# Patient Record
Sex: Female | Born: 1995 | Race: White | Hispanic: No | Marital: Single | Smoking: Former smoker
Health system: Southern US, Community
[De-identification: ages and names within clinical notes are randomized; demographics above are authoritative.]

## PROBLEM LIST (undated history)

## (undated) DIAGNOSIS — B019 Varicella without complication: Secondary | ICD-10-CM

## (undated) DIAGNOSIS — R42 Dizziness and giddiness: Secondary | ICD-10-CM

## (undated) HISTORY — PX: WISDOM TOOTH EXTRACTION: SHX21

## (undated) HISTORY — DX: Dizziness and giddiness: R42

## (undated) HISTORY — DX: Varicella without complication: B01.9

---
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
## 2014-12-14 DIAGNOSIS — B009 Herpesviral infection, unspecified: Secondary | ICD-10-CM | POA: Insufficient documentation

## 2014-12-15 ENCOUNTER — Ambulatory Visit (INDEPENDENT_AMBULATORY_CARE_PROVIDER_SITE_OTHER): Payer: BLUE CROSS/BLUE SHIELD | Admitting: Family Medicine

## 2014-12-15 ENCOUNTER — Encounter: Payer: Self-pay | Admitting: Family Medicine

## 2014-12-15 VITALS — BP 109/74 | HR 72 | Temp 98.3°F | Ht 65.1 in | Wt 223.2 lb

## 2014-12-15 DIAGNOSIS — H6092 Unspecified otitis externa, left ear: Secondary | ICD-10-CM | POA: Diagnosis not present

## 2014-12-15 MED ORDER — CIPROFLOXACIN-DEXAMETHASONE 0.3-0.1 % OT SUSP: 4.0000 [drp] | Freq: Two times a day (BID) | OTIC | Status: AC

## 2014-12-15 NOTE — Progress Notes (Signed)
BP 109/74 mmHg  Pulse 72  Temp(Src) 98.3 F (36.8 C)  Ht 5' 5.1" (1.654 m)  Wt 223 lb 3.2 oz (101.243 kg)  BMI 37.01 kg/m2  SpO2 97%  LMP  (LMP Unknown)   Subjective:    Patient ID: Alexandra Massey, female    DOB: November 26, 1995, 19 y.o.   MRN: [id number]  HPI: Alexandra Massey is a 19 y.o. female  Chief Complaint  Patient presents with  . Ear Pain    left ear pain, sore to the touch, tried ear drops which made it worse, aching, complains that it has affected her jaw. Started 4 days again.    EAR Pain Duration: 4 days Involved ear(s):  left Sensation of feeling clogged/plugged: yes Decreased/muffled hearing:yes Ear pain: yes Fever: no Otorrhea: no Hearing loss: yes Upper respiratory infection symptoms: no Using Q-Tips: yes Status: worse History of cerumenosis: yes Treatments attempted: OTC ear drops  Relevant past medical, surgical, family and social history reviewed and updated as indicated. Interim medical history since our last visit reviewed. Allergies and medications reviewed and updated.  Review of Systems  Constitutional: Negative.   HENT: Positive for ear discharge, ear pain and hearing loss. Negative for congestion, dental problem, drooling, facial swelling, mouth sores, nosebleeds, postnasal drip, rhinorrhea, sinus pressure, sneezing, sore throat, tinnitus, trouble swallowing and voice change.   Respiratory: Negative.   Cardiovascular: Negative.   Psychiatric/Behavioral: Negative.     Per HPI unless specifically indicated above     Objective:    BP 109/74 mmHg  Pulse 72  Temp(Src) 98.3 F (36.8 C)  Ht 5' 5.1" (1.654 m)  Wt 223 lb 3.2 oz (101.243 kg)  BMI 37.01 kg/m2  SpO2 97%  LMP  (LMP Unknown)  Wt Readings from Last 3 Encounters:  12/15/14 223 lb 3.2 oz (101.243 kg)  12/04/12 181 lb (82.101 kg) (95 %*, Z = 1.69)   * Growth percentiles are based on CDC 2-20 Years data.    Physical Exam  Constitutional: She is oriented to person,  place, and time. She appears well-developed and well-nourished. No distress.  HENT:  Head: Normocephalic and atraumatic.  Right Ear: Hearing, tympanic membrane, external ear and ear canal normal.  Left Ear: Tympanic membrane normal. No lacerations. There is swelling and tenderness. No drainage. No foreign bodies. No mastoid tenderness. Tympanic membrane is not injected, not scarred, not perforated, not erythematous, not retracted and not bulging. Tympanic membrane mobility is normal.  No middle ear effusion. No hemotympanum. Decreased hearing is noted.  Nose: Nose normal.  Mouth/Throat: Oropharynx is clear and moist. No oropharyngeal exudate.  Pus in EAC, +tenderness on movement of the tragus  Eyes: Conjunctivae and lids are normal. Pupils are equal, round, and reactive to light. Right eye exhibits no discharge. Left eye exhibits no discharge. No scleral icterus.  Pulmonary/Chest: Effort normal. No respiratory distress.  Musculoskeletal: Normal range of motion.  Neurological: She is alert and oriented to person, place, and time.  Skin: Skin is intact. No rash noted.  Psychiatric: She has a normal mood and affect. Her speech is normal and behavior is normal. Judgment and thought content normal. Cognition and memory are normal.  Nursing note and vitals reviewed.   Results for orders placed or performed in visit on 04/10/13  Amylase  Result Value Ref Range   Amylase 33.0 25-106 Unit/L  Lipase, blood  Result Value Ref Range   Lipase 60 (L) 73-393 Unit/L      Assessment & Plan:  Problem List Items Addressed This Visit    None    Visit Diagnoses    Otitis externa, left    -  Primary    Will treat with ciprodex. Call if not getting better or getting worse.         Follow up plan: Return if symptoms worsen or fail to improve.

## 2014-12-15 NOTE — Patient Instructions (Signed)
Otitis Externa  Otitis externa is a germ infection in the outer ear. The outer ear is the area from the eardrum to the outside of the ear. Otitis externa is sometimes called "swimmer's ear."  HOME CARE  · Put drops in the ear as told by your doctor.  · Only take medicine as told by your doctor.  · If you have diabetes, your doctor may give you more directions. Follow your doctor's directions.  · Keep all doctor visits as told.  To avoid another infection:  · Keep your ear dry. Use the corner of a towel to dry your ear after swimming or bathing.  · Avoid scratching or putting things inside your ear.  · Avoid swimming in lakes, dirty water, or pools that use a chemical called chlorine poorly.  · You may use ear drops after swimming. Combine equal amounts of white vinegar and alcohol in a bottle. Put 3 or 4 drops in each ear.  GET HELP IF:   · You have a fever.  · Your ear is still red, puffy (swollen), or painful after 3 days.  · You still have yellowish-white fluid (pus) coming from the ear after 3 days.  · Your redness, puffiness, or pain gets worse.  · You have a really bad headache.  · You have redness, puffiness, pain, or tenderness behind your ear.  MAKE SURE YOU:   · Understand these instructions.  · Will watch your condition.  · Will get help right away if you are not doing well or get worse.  Document Released: 11/08/2007 Document Revised: 10/06/2013 Document Reviewed: 06/08/2011  [employer]® Patient Information ©2015 [employer]. This information is not intended to replace advice given to you by your health care provider. Make sure you discuss any questions you have with your health care provider.

## 2014-12-25 IMAGING — CR DG CHEST 2V
1 series · 2 of 2 positions shown · non-contrast
Comparison: None.

CLINICAL DATA: Fever and chest pain

EXAM:
CHEST  2 VIEW

[Series 1: pa · 0.17mm/px · 2 of 2 slices shown]
[im 1/2]
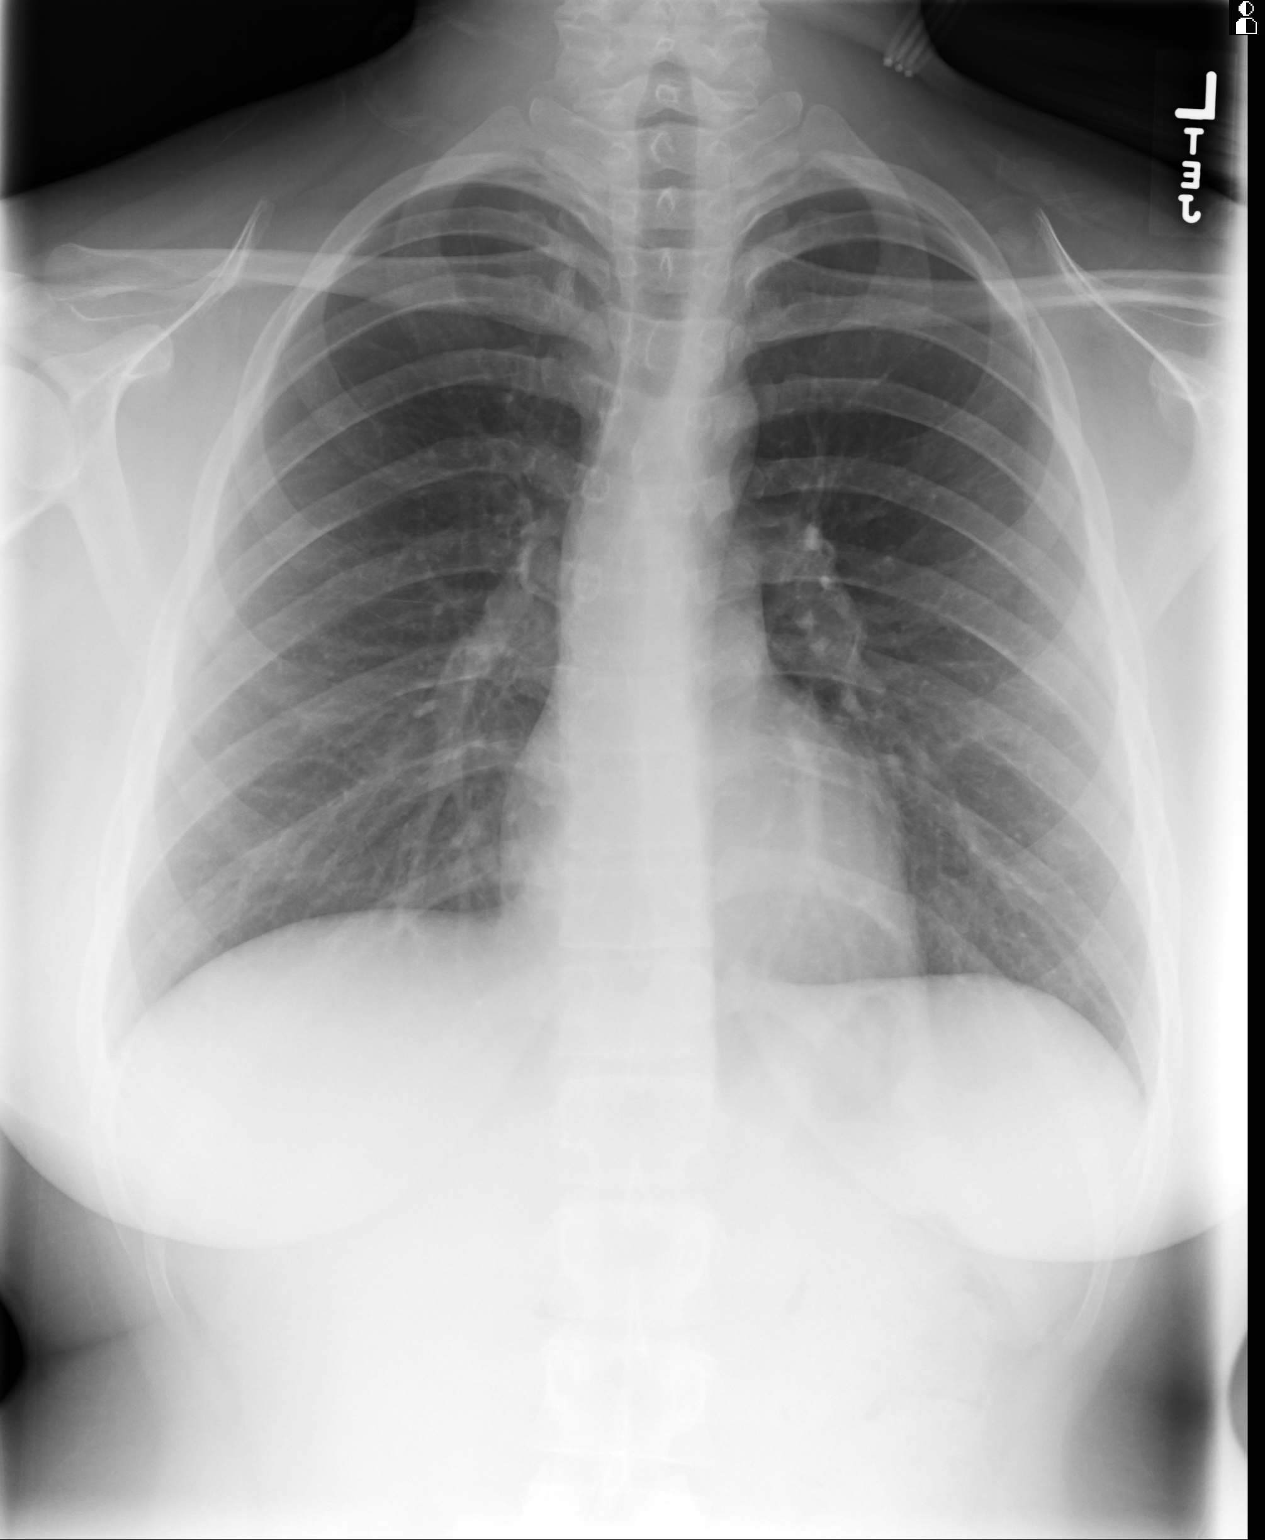
[im 2/2]
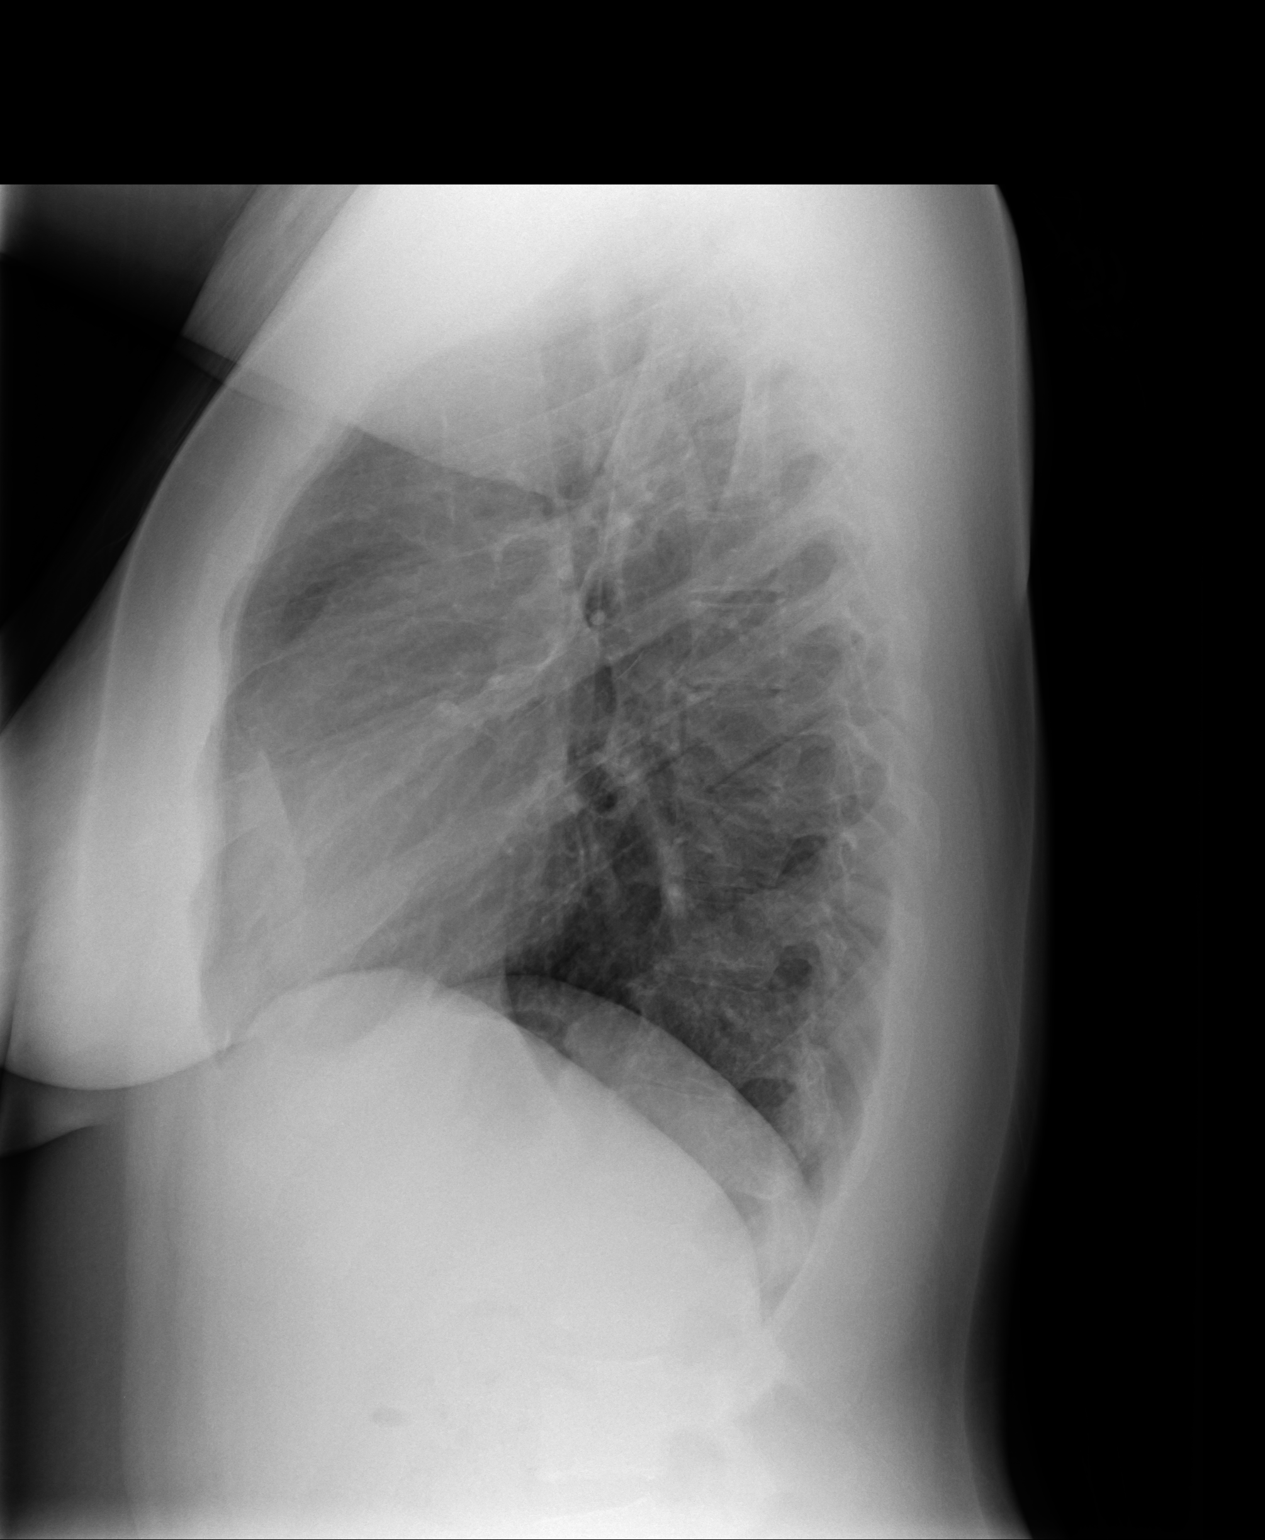

[2 of 2 positions shown; findings below may reference images not displayed]

FINDINGS: Lungs are clear. Heart size and pulmonary vascularity are normal. No
adenopathy. No bone lesions. No pneumothorax.
IMPRESSION: No abnormality noted.

## 2015-08-04 ENCOUNTER — Encounter: Payer: Self-pay | Admitting: Family Medicine

## 2015-08-04 ENCOUNTER — Ambulatory Visit (INDEPENDENT_AMBULATORY_CARE_PROVIDER_SITE_OTHER): Payer: BLUE CROSS/BLUE SHIELD | Admitting: Family Medicine

## 2015-08-04 VITALS — BP 101/69 | HR 69 | Temp 97.6°F | Ht 64.5 in | Wt 225.0 lb

## 2015-08-04 DIAGNOSIS — G8929 Other chronic pain: Secondary | ICD-10-CM | POA: Insufficient documentation

## 2015-08-04 DIAGNOSIS — M545 Low back pain: Secondary | ICD-10-CM | POA: Diagnosis not present

## 2015-08-04 MED ORDER — NAPROXEN 500 MG PO TABS: 500.0000 mg | ORAL_TABLET | Freq: Two times a day (BID) | ORAL | Status: DC

## 2015-08-04 NOTE — Assessment & Plan Note (Signed)
Discussed that medicine won't necessarily help with her issue. Will get her into see PT. Naproxen BID. Not enough spasm for muscle relaxer at this time. Recheck 3 weeks. Continue to monitor.

## 2015-08-04 NOTE — Progress Notes (Signed)
BP 101/69 mmHg  Pulse 69  Temp(Src) 97.6 F (36.4 C)  Ht 5' 4.5" (1.638 m)  Wt 225 lb (102.059 kg)  BMI 38.04 kg/m2  SpO2 97%   Subjective:    Patient ID: Alexandra Massey, female    DOB: [birth year], 20 y.o.   MRN: [id number]  HPI: Alexandra Massey is a 20 y.o. female  Chief Complaint  Patient presents with  . Back Pain    Patient injured her back at work, she states that her hips are out of alignment and she wakes up in severe pain. She states that it will pinch nerves.   ER FOLLOW UP Time since discharge: 6 days Hospital/facility: [facility] Diagnosis: lumbar strain Procedures/tests: None Consultants: none New medications: motrin and flexeril Discharge instructions: follow up here  Status: better  BACK PAIN- has done some marijuana for back pain in the past, nothing else Duration: 2 years, exacerbated for 6 days Mechanism of injury: lifting Location: bilateral and low back Onset: gradual Severity: 5/10 Quality: dull and aching Frequency: constant Radiation: none Aggravating factors: unknown- doesn't know what makes the pain better or worse Alleviating factors: marijuana, ice and heat Status: fluctuating Treatments attempted: rest, ice, heat, APAP, ibuprofen and aleve  Relief with NSAIDs?: mild Nighttime pain:  yes Paresthesias / decreased sensation:  no Bowel / bladder incontinence:  no Fevers:  no Dysuria / urinary frequency:  no  Relevant past medical, surgical, family and social history reviewed and updated as indicated. Interim medical history since our last visit reviewed. Allergies and medications reviewed and updated.  Review of Systems  Constitutional: Negative.   Respiratory: Negative.   Cardiovascular: Negative.   Musculoskeletal: Positive for myalgias, back pain and gait problem. Negative for joint swelling, arthralgias, neck pain and neck stiffness.  Psychiatric/Behavioral: Negative.     Per HPI unless specifically indicated above      Objective:    BP 101/69 mmHg  Pulse 69  Temp(Src) 97.6 F (36.4 C)  Ht 5' 4.5" (1.638 m)  Wt 225 lb (102.059 kg)  BMI 38.04 kg/m2  SpO2 97%  Wt Readings from Last 3 Encounters:  08/04/15 225 lb (102.059 kg)  12/15/14 223 lb 3.2 oz (101.243 kg)  12/04/12 181 lb (82.101 kg) (95 %*, Z = 1.69)   * Growth percentiles are based on CDC 2-20 Years data.    Physical Exam  Constitutional: She is oriented to person, place, and time. She appears well-developed and well-nourished. No distress.  HENT:  Head: Normocephalic and atraumatic.  Right Ear: Hearing normal.  Left Ear: Hearing normal.  Nose: Nose normal.  Eyes: Conjunctivae and lids are normal. Right eye exhibits no discharge. Left eye exhibits no discharge. No scleral icterus.  Cardiovascular: Normal rate, regular rhythm, normal heart sounds and intact distal pulses.  Exam reveals no gallop and no friction rub.   No murmur heard. Pulmonary/Chest: Effort normal and breath sounds normal. No respiratory distress. She has no wheezes. She has no rales. She exhibits no tenderness.  Neurological: She is alert and oriented to person, place, and time.  Skin: Skin is warm, dry and intact. No rash noted. No erythema. No pallor.  Psychiatric: She has a normal mood and affect. Her speech is normal and behavior is normal. Judgment and thought content normal. Cognition and memory are normal.  Nursing note and vitals reviewed. Back Exam:    Inspection:  Normal spinal curvature.  No deformity, ecchymosis, erythema, or lesions     Palpation:  Midline spinal tenderness: no      Paralumbar tenderness: yes bilateral     Parathoracic tenderness: no      Buttocks tenderness: no     Range of Motion:      Flexion: Fingers to Mid-[place]     Extension:Decreased     Lateral bending:Decreased    Rotation:Decreased    Neuro Exam:Lower extremity DTRs normal & symmetric.  Strength and sensation intact.    Special Tests:      Straight leg  raise:negative  Results for orders placed or performed in visit on 04/10/13  Amylase  Result Value Ref Range   Amylase 33.0 25-106 Unit/L  Lipase, blood  Result Value Ref Range   Lipase 60 (L) 73-393 Unit/L      Assessment & Plan:   Problem List Items Addressed This Visit      Other   Chronic low back pain - Primary    Discussed that medicine won't necessarily help with her issue. Will get her into see PT. Naproxen BID. Not enough spasm for muscle relaxer at this time. Recheck 3 weeks. Continue to monitor.       Relevant Medications   naproxen (NAPROSYN) 500 MG tablet       Follow up plan: Return in about 3 weeks (around 08/25/2015) for follow up back pain.

## 2015-08-25 ENCOUNTER — Ambulatory Visit (INDEPENDENT_AMBULATORY_CARE_PROVIDER_SITE_OTHER): Payer: BLUE CROSS/BLUE SHIELD | Admitting: Family Medicine

## 2015-08-25 ENCOUNTER — Encounter: Payer: Self-pay | Admitting: Family Medicine

## 2015-08-25 VITALS — BP 101/69 | HR 75 | Temp 97.8°F | Ht 65.3 in | Wt 223.0 lb

## 2015-08-25 DIAGNOSIS — M545 Low back pain: Secondary | ICD-10-CM

## 2015-08-25 DIAGNOSIS — G8929 Other chronic pain: Secondary | ICD-10-CM

## 2015-08-25 MED ORDER — TIZANIDINE HCL 4 MG PO CAPS: 4.0000 mg | ORAL_CAPSULE | Freq: Three times a day (TID) | ORAL | Status: DC | PRN

## 2015-08-25 MED ORDER — NAPROXEN 500 MG PO TABS: 500.0000 mg | ORAL_TABLET | Freq: Two times a day (BID) | ORAL | Status: DC

## 2015-08-25 NOTE — Progress Notes (Signed)
BP 101/69 mmHg  Pulse 75  Temp(Src) 97.8 F (36.6 C)  Ht 5' 5.3" (1.659 m)  Wt 223 lb (101.152 kg)  BMI 36.75 kg/m2  SpO2 99%   Subjective:    Patient ID: Alexandra Massey, female    DOB: [birth year], 20 y.o.   MRN: [id number]  HPI: Alexandra Massey is a 20 y.o. female  Chief Complaint  Patient presents with  . Back Pain    follow up   BACK PAIN- has done some marijuana for back pain in the past, nothing else Duration: 2 years, better since last visit, but still not normal. Has not gotten in to see PT yet due to cost Mechanism of injury: lifting Location: bilateral and low back Onset: gradual Severity: 5/10 Quality: dull and aching Frequency: constant Radiation: none Aggravating factors: unknown- doesn't know what makes the pain better or worse Alleviating factors: marijuana, ice and heat, naproxen sometimes Status: fluctuating Treatments attempted: rest, ice, heat, APAP, ibuprofen and aleve  Relief with NSAIDs?: moderate Nighttime pain: yes Paresthesias / decreased sensation: no Bowel / bladder incontinence: no Fevers: no Dysuria / urinary frequency: no  Relevant past medical, surgical, family and social history reviewed and updated as indicated. Interim medical history since our last visit reviewed. Allergies and medications reviewed and updated.  Review of Systems  Constitutional: Negative.   Respiratory: Negative.   Cardiovascular: Negative.   Musculoskeletal: Positive for myalgias and back pain. Negative for joint swelling, arthralgias, gait problem, neck pain and neck stiffness.  Psychiatric/Behavioral: Negative.     Per HPI unless specifically indicated above     Objective:    BP 101/69 mmHg  Pulse 75  Temp(Src) 97.8 F (36.6 C)  Ht 5' 5.3" (1.659 m)  Wt 223 lb (101.152 kg)  BMI 36.75 kg/m2  SpO2 99%  Wt Readings from Last 3 Encounters:  08/25/15 223 lb (101.152 kg)  08/04/15 225 lb (102.059 kg)  12/15/14 223 lb 3.2 oz (101.243 kg)     Physical Exam  Constitutional: She is oriented to person, place, and time. She appears well-developed and well-nourished. No distress.  HENT:  Head: Normocephalic and atraumatic.  Right Ear: Hearing normal.  Left Ear: Hearing normal.  Nose: Nose normal.  Eyes: Conjunctivae and lids are normal. Right eye exhibits no discharge. Left eye exhibits no discharge. No scleral icterus.  Pulmonary/Chest: Effort normal. No respiratory distress.  Neurological: She is alert and oriented to person, place, and time.  Skin: Skin is warm, dry and intact. No rash noted. No erythema. No pallor.  Psychiatric: She has a normal mood and affect. Her speech is normal and behavior is normal. Judgment and thought content normal. Cognition and memory are normal.  Nursing note and vitals reviewed. Back Exam:    Inspection:  Normal spinal curvature.  No deformity, ecchymosis, erythema, or lesions     Palpation:     Midline spinal tenderness: no      Paralumbar tenderness: yes R>L     Parathoracic tenderness: no      Buttocks tenderness: no     Range of Motion:      Flexion: Fingers to Knees     Extension:Decreased     Lateral bending:Decreased    Rotation:Decreased    Neuro Exam:Lower extremity DTRs normal & symmetric.  Strength and sensation intact.    Special Tests:      Straight leg raise:negative   Results for orders placed or performed in visit on 04/10/13  Amylase  Result Value Ref  Range   Amylase 33.0 25-106 Unit/L  Lipase, blood  Result Value Ref Range   Lipase 60 (L) 73-393 Unit/L      Assessment & Plan:   Problem List Items Addressed This Visit      Other   Chronic low back pain - Primary    Slightly more spasm than previously. Will treat with tizanidine. Continue naproxen. Encouraged her to go to PT. Call if getting worse. Continue exercise. Follow up 1 month.       Relevant Medications   tiZANidine (ZANAFLEX) 4 MG capsule   naproxen (NAPROSYN) 500 MG tablet       Follow up  plan: Return in about 4 weeks (around 09/22/2015) for follow up back.

## 2015-08-25 NOTE — Assessment & Plan Note (Signed)
Slightly more spasm than previously. Will treat with tizanidine. Continue naproxen. Encouraged her to go to PT. Call if getting worse. Continue exercise. Follow up 1 month.

## 2015-08-31 ENCOUNTER — Telehealth: Payer: Self-pay | Admitting: Family Medicine

## 2015-08-31 NOTE — Telephone Encounter (Signed)
[facility] called and stated that they needed a referral sent over regarding the pts lower back pain. Fax number [phone]

## 2015-09-28 ENCOUNTER — Ambulatory Visit (INDEPENDENT_AMBULATORY_CARE_PROVIDER_SITE_OTHER): Payer: BLUE CROSS/BLUE SHIELD | Admitting: Family Medicine

## 2015-09-28 ENCOUNTER — Encounter: Payer: Self-pay | Admitting: Family Medicine

## 2015-09-28 VITALS — BP 104/72 | HR 71 | Temp 98.3°F | Ht 64.7 in | Wt 218.0 lb

## 2015-09-28 DIAGNOSIS — M545 Low back pain: Secondary | ICD-10-CM | POA: Diagnosis not present

## 2015-09-28 DIAGNOSIS — G8929 Other chronic pain: Secondary | ICD-10-CM | POA: Diagnosis not present

## 2015-09-28 MED ORDER — NYSTATIN-TRIAMCINOLONE 100000-0.1 UNIT/GM-% EX OINT: 1.0000 "application " | TOPICAL_OINTMENT | Freq: Two times a day (BID) | CUTANEOUS | Status: DC

## 2015-09-28 NOTE — Progress Notes (Signed)
BP 104/72 mmHg  Pulse 71  Temp(Src) 98.3 F (36.8 C)  Ht 5' 4.7" (1.643 m)  Wt 218 lb (98.884 kg)  BMI 36.63 kg/m2  SpO2 97%   Subjective:    Patient ID: Alexandra Massey, female    DOB: 07-Jun-1995, 20 y.o.   MRN: [id number]  HPI: Alexandra ColonelVictoria M Massey is a 20 y.o. female  Chief Complaint  Patient presents with  . Back Pain    Patient states that the pain is better, except now due to rain and a long car ride.   BACK PAIN- has not been doing the stretches, but has been doing PT and that has been helping. Was doing well until about a day ago with a long car ride  Duration: 2 years, better since last visit, but still not normal. PT has been helping Mechanism of injury: lifting Location: bilateral and low back Onset: gradual Severity: 5/10 Quality: dull and aching Frequency: constant Radiation: none Aggravating factors: unknown- doesn't know what makes the pain better or worse, medicine helps sometimes Alleviating factors: marijuana, ice and heat, naproxen sometimes Status: fluctuating Treatments attempted: rest, ice, heat, APAP, ibuprofen and aleve  Relief with NSAIDs?: moderate Nighttime pain: yes Paresthesias / decreased sensation: no Bowel / bladder incontinence: no Fevers: no Dysuria / urinary frequency: no  Relevant past medical, surgical, family and social history reviewed and updated as indicated. Interim medical history since our last visit reviewed. Allergies and medications reviewed and updated.  Review of Systems  Constitutional: Negative.   Respiratory: Negative.   Cardiovascular: Negative.   Musculoskeletal: Positive for back pain. Negative for myalgias, joint swelling, arthralgias, gait problem, neck pain and neck stiffness.  Psychiatric/Behavioral: Negative.     Per HPI unless specifically indicated above     Objective:    BP 104/72 mmHg  Pulse 71  Temp(Src) 98.3 F (36.8 C)  Ht 5' 4.7" (1.643 m)  Wt 218 lb (98.884 kg)  BMI 36.63 kg/m2   SpO2 97%  Wt Readings from Last 3 Encounters:  09/28/15 218 lb (98.884 kg)  08/25/15 223 lb (101.152 kg)  08/04/15 225 lb (102.059 kg)    Physical Exam  Constitutional: She is oriented to person, place, and time. She appears well-developed and well-nourished. No distress.  HENT:  Head: Normocephalic and atraumatic.  Right Ear: Hearing normal.  Left Ear: Hearing normal.  Nose: Nose normal.  Eyes: Conjunctivae and lids are normal. Right eye exhibits no discharge. Left eye exhibits no discharge. No scleral icterus.  Cardiovascular: Normal rate, regular rhythm, normal heart sounds and intact distal pulses.  Exam reveals no gallop and no friction rub.   No murmur heard. Pulmonary/Chest: Effort normal and breath sounds normal. No respiratory distress. She has no wheezes. She has no rales. She exhibits no tenderness.  Neurological: She is alert and oriented to person, place, and time.  Skin: Skin is warm, dry and intact. No rash noted. No erythema. No pallor.  Psychiatric: She has a normal mood and affect. Her speech is normal and behavior is normal. Judgment and thought content normal. Cognition and memory are normal.  Nursing note and vitals reviewed. Back Exam:    Inspection:  Normal spinal curvature.  No deformity, ecchymosis, erythema, or lesions     Palpation:     Midline spinal tenderness: no      Paralumbar tenderness: yes bilateral     Parathoracic tenderness: no      Buttocks tenderness: no     Range of Motion:  Flexion: Fingers to Knees     Extension:Decreased     Lateral bending:Decreased    Rotation:Decreased    Neuro Exam:Lower extremity DTRs normal & symmetric.  Strength and sensation intact.    Special Tests:      Straight leg raise:negative    Results for orders placed or performed in visit on 04/10/13  Amylase  Result Value Ref Range   Amylase 33.0 25-106 Unit/L  Lipase, blood  Result Value Ref Range   Lipase 60 (L) 73-393 Unit/L      Assessment & Plan:    Problem List Items Addressed This Visit      Other   Chronic low back pain - Primary    Continue current regimen. Continue to monitor. Call with any problems. Recheck after PT.           Follow up plan: Return 2-3 months, for follow up back.

## 2015-09-28 NOTE — Assessment & Plan Note (Signed)
Continue current regimen. Continue to monitor. Call with any problems. Recheck after PT.

## 2015-11-25 ENCOUNTER — Telehealth: Payer: Self-pay | Admitting: Family Medicine

## 2015-11-25 MED ORDER — NYSTATIN-TRIAMCINOLONE 100000-0.1 UNIT/GM-% EX OINT: 1.0000 "application " | TOPICAL_OINTMENT | Freq: Two times a day (BID) | CUTANEOUS | Status: DC

## 2015-11-25 NOTE — Telephone Encounter (Signed)
Rx sent to her pharmacy. If this doesn't make it go away, needs to be seen

## 2015-11-25 NOTE — Telephone Encounter (Signed)
Dr.Johnson, is it possible to call something in for her, or does she need to be seen?

## 2015-11-25 NOTE — Telephone Encounter (Signed)
Patient needs to talk to Dr. Laural BenesJohnson about getting a new Rx for her ringworm. Also needs to talk to her about it, thanks.

## 2015-11-25 NOTE — Telephone Encounter (Signed)
Left message for patient letting her know that medication was sent to her pharmacy.

## 2015-11-30 ENCOUNTER — Ambulatory Visit: Payer: BLUE CROSS/BLUE SHIELD | Admitting: Family Medicine

## 2016-02-08 ENCOUNTER — Telehealth: Payer: Self-pay | Admitting: Family Medicine

## 2016-02-08 ENCOUNTER — Other Ambulatory Visit: Payer: Self-pay | Admitting: Family Medicine

## 2016-02-08 MED ORDER — PENCICLOVIR 1 % EX CREA: 1.0000 "application " | TOPICAL_CREAM | CUTANEOUS | 0 refills | Status: DC

## 2016-02-08 MED ORDER — VALACYCLOVIR HCL 1 G PO TABS: 1000.0000 mg | ORAL_TABLET | Freq: Two times a day (BID) | ORAL | 0 refills | Status: AC

## 2016-02-08 NOTE — Telephone Encounter (Signed)
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

## 2016-02-08 NOTE — Telephone Encounter (Signed)
Patient notified about rxs. 

## 2016-02-08 NOTE — Telephone Encounter (Signed)
Routing to provider  

## 2016-02-08 NOTE — Telephone Encounter (Signed)
Rxs sent to her pharmacy 

## 2016-02-13 ENCOUNTER — Ambulatory Visit
Admission: EM | Admit: 2016-02-13 | Discharge: 2016-02-13 | Disposition: A | Discharge status: Home / Self Care | Payer: BLUE CROSS/BLUE SHIELD | Attending: Family Medicine | Admitting: Family Medicine

## 2016-02-13 ENCOUNTER — Encounter: Payer: Self-pay | Admitting: Emergency Medicine

## 2016-02-13 DIAGNOSIS — B349 Viral infection, unspecified: Secondary | ICD-10-CM | POA: Diagnosis not present

## 2016-02-13 MED ORDER — ONDANSETRON 4 MG PO TBDP: 4.0000 mg | ORAL_TABLET | Freq: Three times a day (TID) | ORAL | 0 refills | Status: DC | PRN

## 2016-02-13 NOTE — Discharge Instructions (Signed)
Rest, hydration, BRAT diet as tolerated, Zofran when necessary, seek medical attention if symptoms persist or worsen as discussed.

## 2016-02-13 NOTE — ED Triage Notes (Signed)
Patient states that she has been out of work since [employer]with N/V/D for two days.  Patient states that her work is asking for a doctor's note.  Patient states that she is feeling better.  Patient states that she has a cough and congestion still.

## 2016-02-13 NOTE — ED Provider Notes (Signed)
[facility]    CSN: [id number] Arrival date & time: 02/13/16  0908  First Provider Contact:  None       History   Chief Complaint Chief Complaint  Patient presents with  . Mouth Lesions  . Cough  . Nasal Congestion    HPI Alexandra Massey is a 20 y.o. female.   HPI: Patient presents today with symptoms of nasal drainage, mild nonproductive cough, cold sore, nausea, vomiting, diarrhea. Patient states that her symptoms started a few days ago. Her vomiting and diarrhea has decreased some but still has some nausea. Patient denies any chest pain, shortness of breath, rash, urinary symptoms. Patient does have Mirena in place and does not have menstrual cycles. Her cold sore has started to heal on her lower lip. She is not taking any medications for his symptoms. She has tolerated some soup.  Past Medical History:  Diagnosis Date  . Chicken pox     Patient Active Problem List   Diagnosis Date Noted  . Chronic low back pain 08/04/2015  . 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 KNEE ARTHROSCOPY Right 2009   hperextended, went in and cleaned it out  . WISDOM TOOTH EXTRACTION      OB History    No data available       Home Medications    Prior to Admission medications   Medication Sig Start Date End Date Taking? Authorizing Provider  levonorgestrel (MIRENA) 20 MCG/24HR IUD 1 each by Intrauterine route once.   Yes Historical Provider, MD  valACYclovir (VALTREX) 1000 MG tablet Take 1 tablet (1,000 mg total) by mouth 2 (two) times daily. 02/08/16   Megan Holly BodilyP Johnson, DO    Family History Family History  Problem Relation Age of Onset  . Heart attack Paternal Grandfather   . Diabetes Paternal Grandfather     Social History Social History  Substance Use Topics  . Smoking status: Never Smoker  . Smokeless tobacco: Never Used  . Alcohol use 0.0 oz/week     Comment: Socially       Allergies   Review of patient's allergies indicates no known allergies.   Review of Systems Review of Systems: Negative except mentioned above.    Physical Exam Triage Vital Signs ED Triage Vitals  Enc Vitals Group     BP 02/13/16 0924 109/67     Pulse Rate 02/13/16 0924 85     Resp 02/13/16 0924 16     Temp 02/13/16 0924 97.6 F (36.4 C)     Temp Source 02/13/16 0924 Tympanic     SpO2 02/13/16 0924 100 %     Weight 02/13/16 0924 206 lb (93.4 kg)     Height 02/13/16 0924 5\' 5"  (1.651 m)     Head Circumference --      Peak Flow --      Pain Score 02/13/16 0926 3     Pain Loc --      Pain Edu? --      Excl. in GC? --    No data found.   Updated Vital Signs BP 109/67 (BP Location: Left Arm)   Pulse 85   Temp 97.6 F (36.4 C) (Tympanic)   Resp 16   Ht 5\' 5"  (1.651 m)   Wt 206 lb (93.4 kg)   SpO2 100%   BMI 34.28 kg/m     Physical Exam:   GENERAL: NAD HEENT: no  pharyngeal erythema, no exudate, no erythema of TMs, no cervical LAD RESP: CTA B CARD: RRR ABD: +BS, NT/ND, no rebound or guarding   NEURO: CN II-XII grossly intact    UC Treatments / Results  Labs (all labs ordered are listed, but only abnormal results are displayed) Labs Reviewed - No data to display  EKG  EKG Interpretation None       Radiology No results found.  Procedures Procedures (including critical care time)  Medications Ordered in UC Medications - No data to display   Initial Impression / Assessment and Plan / UC Course  I have reviewed the triage vital signs and the nursing notes.  Pertinent labs & imaging results that were available during my care of the patient were reviewed by me and considered in my medical decision making (see chart for details).  Clinical Course   A/P: Viral Illness - rest, hydration, BRAT diet, Zofran prn, seek medical attention if symptoms persist/worsen.   Final Clinical Impressions(s) / UC Diagnoses   Final diagnoses:  None     New Prescriptions New Prescriptions   No medications on file     Jolene Provost, MD 02/13/16 1009

## 2016-05-05 ENCOUNTER — Encounter: Payer: Self-pay | Admitting: Unknown Physician Specialty

## 2016-05-05 ENCOUNTER — Ambulatory Visit (INDEPENDENT_AMBULATORY_CARE_PROVIDER_SITE_OTHER): Payer: BLUE CROSS/BLUE SHIELD | Admitting: Unknown Physician Specialty

## 2016-05-05 VITALS — BP 107/77 | HR 67 | Temp 97.9°F | Ht 66.0 in | Wt 217.0 lb

## 2016-05-05 DIAGNOSIS — R52 Pain, unspecified: Secondary | ICD-10-CM

## 2016-05-05 DIAGNOSIS — J00 Acute nasopharyngitis [common cold]: Secondary | ICD-10-CM | POA: Diagnosis not present

## 2016-05-05 LAB — VERITOR FLU A/B WAIVED
INFLUENZA B: NEGATIVE
Influenza A: NEGATIVE

## 2016-05-05 NOTE — Patient Instructions (Addendum)
Upper Respiratory Infection, Adult Most upper respiratory infections (URIs) are a viral infection of the air passages leading to the lungs. A URI affects the nose, throat, and upper air passages. The most common type of URI is nasopharyngitis and is typically referred to as "the common cold." URIs run their course and usually go away on their own. Most of the time, a URI does not require medical attention, but sometimes a bacterial infection in the upper airways can follow a viral infection. This is called a secondary infection. Sinus and middle ear infections are common types of secondary upper respiratory infections. Bacterial pneumonia can also complicate a URI. A URI can worsen asthma and chronic obstructive pulmonary disease (COPD). Sometimes, these complications can require emergency medical care and may be life threatening. What are the causes? Almost all URIs are caused by viruses. A virus is a type of germ and can spread from one person to another. What increases the risk? You may be at risk for a URI if:  You smoke.  You have chronic heart or lung disease.  You have a weakened defense (immune) system.  You are very young or very old.  You have nasal allergies or asthma.  You work in crowded or poorly ventilated areas.  You work in health care facilities or schools.  What are the signs or symptoms? Symptoms typically develop 2-3 days after you come in contact with a cold virus. Most viral URIs last 7-10 days. However, viral URIs from the influenza virus (flu virus) can last 14-18 days and are typically more severe. Symptoms may include:  Runny or stuffy (congested) nose.  Sneezing.  Cough.  Sore throat.  Headache.  Fatigue.  Fever.  Loss of appetite.  Pain in your forehead, behind your eyes, and over your cheekbones (sinus pain).  Muscle aches.  How is this diagnosed? Your health care provider may diagnose a URI by:  Physical exam.  Tests to check that your  symptoms are not due to another condition such as: ? Strep throat. ? Sinusitis. ? Pneumonia. ? Asthma.  How is this treated? A URI goes away on its own with time. It cannot be cured with medicines, but medicines may be prescribed or recommended to relieve symptoms. Medicines may help:  Reduce your fever.  Reduce your cough.  Relieve nasal congestion.  Follow these instructions at home:  Take medicines only as directed by your health care provider.  Gargle warm saltwater or take cough drops to comfort your throat as directed by your health care provider.  Use a warm mist humidifier or inhale steam from a shower to increase air moisture. This may make it easier to breathe.  Drink enough fluid to keep your urine clear or pale yellow.  Eat soups and other clear broths and maintain good nutrition.  Rest as needed.  Return to work when your temperature has returned to normal or as your health care provider advises. You may need to stay home longer to avoid infecting others. You can also use a face mask and careful hand washing to prevent spread of the virus.  Increase the usage of your inhaler if you have asthma.  Do not use any tobacco products, including cigarettes, chewing tobacco, or electronic cigarettes. If you need help quitting, ask your health care provider. How is this prevented? The best way to protect yourself from getting a cold is to practice good hygiene.  Avoid oral or hand contact with people with cold symptoms.  Wash your   occurs. There is no clear evidence that vitamin C, vitamin E, echinacea, or exercise reduces the chance of developing a cold. However, it is always recommended to get plenty of rest, exercise, and practice good nutrition. Contact a health care provider if:  You are getting worse rather than better.  Your symptoms are not controlled by medicine.  You have chills.  You have worsening shortness of breath.  You have brown  or red mucus.  You have yellow or brown nasal discharge.  You have pain in your face, especially when you bend forward.  You have a fever.  You have swollen neck glands.  You have pain while swallowing.  You have white areas in the back of your throat. Get help right away if:  You have severe or persistent:  Headache.  Ear pain.  Sinus pain.  Chest pain.  You have chronic lung disease and any of the following:  Wheezing.  Prolonged cough.  Coughing up blood.  A change in your usual mucus.  You have a stiff neck.  You have changes in your:  Vision.  Hearing.  Thinking.  Mood. This information is not intended to replace advice given to you by your health care provider. Make sure you discuss any questions you have with your health care provider. Document Released: 11/15/2000 Document Revised: 01/23/2016 Document Reviewed: 08/27/2013 Elsevier Interactive Patient Education  2017 [employer]  I recommend pseudophed OTC

## 2016-05-05 NOTE — Progress Notes (Signed)
BP 107/77 (BP Location: Left Arm, Patient Position: Sitting, Cuff Size: Large)   Pulse 67   Temp 97.9 F (36.6 C) Comment: ear  Ht _0  (1.676 m)   Wt 217 lb (98.4 kg)   SpO2 99%   BMI 35.02 kg/m    Subjective:    Patient ID: Alexandra Massey, female    DOB: February 26, 1996, 20 y.o.   MRN: [id number]  HPI: Alexandra Massey is a 20 y.o. female  Chief Complaint  Patient presents with  . URI    pt states she has a had congestion, runny nose, ear fullness, cough, sneezing, scratchy throat, headache, some dizziness, no energy, and achy. Patient states symptoms started about a week ago.    URI   This is a new problem. Episode onset: Feeling like she will get sick for a week and this AM "it really hit me" The problem has been rapidly worsening. Maximum temperature: felt warm. The fever has been present for less than 1 day. Associated symptoms include congestion, coughing, nausea, rhinorrhea and a sore throat. She has tried acetaminophen and decongestant for the symptoms. The treatment provided mild relief.    Relevant past medical, surgical, family and social history reviewed and updated as indicated. Interim medical history since our last visit reviewed. Allergies and medications reviewed and updated.  Review of Systems  HENT: Positive for congestion, rhinorrhea and sore throat.   Respiratory: Positive for cough.   Gastrointestinal: Positive for nausea.    Per HPI unless specifically indicated above     Objective:    BP 107/77 (BP Location: Left Arm, Patient Position: Sitting, Cuff Size: Large)   Pulse 67   Temp 97.9 F (36.6 C) Comment: ear  Ht _1  (1.676 m)   Wt 217 lb (98.4 kg)   SpO2 99%   BMI 35.02 kg/m   Wt Readings from Last 3 Encounters:  05/05/16 217 lb (98.4 kg)  02/13/16 206 lb (93.4 kg)  09/28/15 218 lb (98.9 kg)    Physical Exam  Constitutional: She is oriented to person, place, and time. She appears well-developed and well-nourished. No distress.   HENT:  Head: Normocephalic and atraumatic.  Right Ear: Tympanic membrane and ear canal normal.  Left Ear: Tympanic membrane and ear canal normal.  Nose: Rhinorrhea present. Right sinus exhibits no maxillary sinus tenderness and no frontal sinus tenderness. Left sinus exhibits no maxillary sinus tenderness and no frontal sinus tenderness.  Mouth/Throat: Mucous membranes are normal. Posterior oropharyngeal erythema present.  Eyes: Conjunctivae and lids are normal. Right eye exhibits no discharge. Left eye exhibits no discharge. No scleral icterus.  Cardiovascular: Normal rate and regular rhythm.   Pulmonary/Chest: Effort normal and breath sounds normal. No respiratory distress.  Abdominal: Normal appearance. There is no splenomegaly or hepatomegaly.  Musculoskeletal: Normal range of motion.  Neurological: She is alert and oriented to person, place, and time.  Skin: Skin is intact. No rash noted. No pallor.  Psychiatric: She has a normal mood and affect. Her behavior is normal. Judgment and thought content normal.    Results for orders placed or performed in visit on 04/10/13  CBC with Differential/Platelet  Result Value Ref Range   WBC 6.7 3.6 - 11.0 x10 3/mm 3   RBC 4.70 3.80 - 5.20 X10 6/mm 3   HGB 14.1 12.0 - 16.0 g/dL   HCT 42.2 35.0 - 47.0 %   MCV 90 80 - 100 fL   MCH 29.9 26.0 - 34.0 pg  MCHC 33.3 32.0 - 36.0 g/dL   RDW 13.4 11.5 - 14.5 %   Platelet 281 150 - 440 x10 3/mm 3   Neutrophil % 76.5 %   Lymphocyte % 13.6 %   Monocyte % 8.7 %   Eosinophil % 0.4 %   Basophil % 0.8 %   Neutrophil # 5.1 1.4 - 6.5 x10 3/mm 3   Lymphocyte # 0.9 (L) 1.0 - 3.6 x10 3/mm 3   Monocyte # 0.6 0.2 - 0.9 x10 3/mm    Eosinophil # 0.0 0.0 - 0.7 x10 3/mm 3   Basophil # 0.1 0.0 - 0.1 x10 3/mm 3  Comprehensive metabolic panel  Result Value Ref Range   Glucose 103 (H) 65 - 99 mg/dL   BUN 7 (L) 9 - 21 mg/dL   Creatinine 0.85 0.60 - 1.30 mg/dL   Sodium 140 132 - 141 mmol/L   Potassium 3.8 3.3 -  4.7 mmol/L   Chloride 103 97 - 107 mmol/L   Co2 29 (H) 16 - 25 mmol/L   Calcium, Total 9.1 9.0 - 10.7 mg/dL   SGOT(AST) 14 0 - 26 Unit/L   SGPT (ALT) 24 12 - 78 U/L   Alkaline Phosphatase 70 (L) 82 - 169 Unit/L   Albumin 3.8 3.8 - 5.6 g/dL   Total Protein 7.6 6.4 - 8.6 g/dL   Bilirubin,Total 0.3 0.2 - 1.0 mg/dL   Osmolality 278 275 - 301   Anion Gap 8 7 - 16   EGFR (African American) >60    EGFR (Non-African Amer.) >60   Pregnancy, urine  Result Value Ref Range   Pregnancy Test, Urine NEGATIVE mIU/mL      Assessment & Plan:   Problem List Items Addressed This Visit    None    Visit Diagnoses    Body aches    -  Primary   Relevant Orders   Veritor Flu A/B Waived   Acute nasopharyngitis           Follow up plan: Return if symptoms worsen or fail to improve.

## 2016-11-06 DIAGNOSIS — M545 Low back pain: Secondary | ICD-10-CM | POA: Diagnosis not present

## 2016-11-06 DIAGNOSIS — M9904 Segmental and somatic dysfunction of sacral region: Secondary | ICD-10-CM | POA: Diagnosis not present

## 2016-11-06 DIAGNOSIS — M9902 Segmental and somatic dysfunction of thoracic region: Secondary | ICD-10-CM | POA: Diagnosis not present

## 2016-11-06 DIAGNOSIS — M9903 Segmental and somatic dysfunction of lumbar region: Secondary | ICD-10-CM | POA: Diagnosis not present

## 2016-11-09 DIAGNOSIS — M9903 Segmental and somatic dysfunction of lumbar region: Secondary | ICD-10-CM | POA: Diagnosis not present

## 2016-11-09 DIAGNOSIS — M9902 Segmental and somatic dysfunction of thoracic region: Secondary | ICD-10-CM | POA: Diagnosis not present

## 2016-11-09 DIAGNOSIS — M545 Low back pain: Secondary | ICD-10-CM | POA: Diagnosis not present

## 2016-11-09 DIAGNOSIS — M9904 Segmental and somatic dysfunction of sacral region: Secondary | ICD-10-CM | POA: Diagnosis not present

## 2016-11-14 DIAGNOSIS — M9904 Segmental and somatic dysfunction of sacral region: Secondary | ICD-10-CM | POA: Diagnosis not present

## 2016-11-14 DIAGNOSIS — M9902 Segmental and somatic dysfunction of thoracic region: Secondary | ICD-10-CM | POA: Diagnosis not present

## 2016-11-14 DIAGNOSIS — M545 Low back pain: Secondary | ICD-10-CM | POA: Diagnosis not present

## 2016-11-14 DIAGNOSIS — M9903 Segmental and somatic dysfunction of lumbar region: Secondary | ICD-10-CM | POA: Diagnosis not present

## 2016-11-16 DIAGNOSIS — M545 Low back pain: Secondary | ICD-10-CM | POA: Diagnosis not present

## 2016-11-16 DIAGNOSIS — M9903 Segmental and somatic dysfunction of lumbar region: Secondary | ICD-10-CM | POA: Diagnosis not present

## 2016-11-16 DIAGNOSIS — M9904 Segmental and somatic dysfunction of sacral region: Secondary | ICD-10-CM | POA: Diagnosis not present

## 2016-11-16 DIAGNOSIS — M9902 Segmental and somatic dysfunction of thoracic region: Secondary | ICD-10-CM | POA: Diagnosis not present

## 2016-11-21 DIAGNOSIS — M9904 Segmental and somatic dysfunction of sacral region: Secondary | ICD-10-CM | POA: Diagnosis not present

## 2016-11-21 DIAGNOSIS — M9902 Segmental and somatic dysfunction of thoracic region: Secondary | ICD-10-CM | POA: Diagnosis not present

## 2016-11-21 DIAGNOSIS — M9903 Segmental and somatic dysfunction of lumbar region: Secondary | ICD-10-CM | POA: Diagnosis not present

## 2016-11-21 DIAGNOSIS — M545 Low back pain: Secondary | ICD-10-CM | POA: Diagnosis not present

## 2016-11-24 DIAGNOSIS — M9902 Segmental and somatic dysfunction of thoracic region: Secondary | ICD-10-CM | POA: Diagnosis not present

## 2016-11-24 DIAGNOSIS — M9903 Segmental and somatic dysfunction of lumbar region: Secondary | ICD-10-CM | POA: Diagnosis not present

## 2016-11-24 DIAGNOSIS — M545 Low back pain: Secondary | ICD-10-CM | POA: Diagnosis not present

## 2016-11-24 DIAGNOSIS — M9904 Segmental and somatic dysfunction of sacral region: Secondary | ICD-10-CM | POA: Diagnosis not present

## 2016-11-30 DIAGNOSIS — M9902 Segmental and somatic dysfunction of thoracic region: Secondary | ICD-10-CM | POA: Diagnosis not present

## 2016-11-30 DIAGNOSIS — M9903 Segmental and somatic dysfunction of lumbar region: Secondary | ICD-10-CM | POA: Diagnosis not present

## 2016-11-30 DIAGNOSIS — M9904 Segmental and somatic dysfunction of sacral region: Secondary | ICD-10-CM | POA: Diagnosis not present

## 2016-11-30 DIAGNOSIS — M545 Low back pain: Secondary | ICD-10-CM | POA: Diagnosis not present

## 2016-12-04 DIAGNOSIS — M9904 Segmental and somatic dysfunction of sacral region: Secondary | ICD-10-CM | POA: Diagnosis not present

## 2016-12-04 DIAGNOSIS — M545 Low back pain: Secondary | ICD-10-CM | POA: Diagnosis not present

## 2016-12-04 DIAGNOSIS — M9903 Segmental and somatic dysfunction of lumbar region: Secondary | ICD-10-CM | POA: Diagnosis not present

## 2016-12-04 DIAGNOSIS — M9902 Segmental and somatic dysfunction of thoracic region: Secondary | ICD-10-CM | POA: Diagnosis not present

## 2016-12-07 DIAGNOSIS — M9904 Segmental and somatic dysfunction of sacral region: Secondary | ICD-10-CM | POA: Diagnosis not present

## 2016-12-07 DIAGNOSIS — M9902 Segmental and somatic dysfunction of thoracic region: Secondary | ICD-10-CM | POA: Diagnosis not present

## 2016-12-07 DIAGNOSIS — M9903 Segmental and somatic dysfunction of lumbar region: Secondary | ICD-10-CM | POA: Diagnosis not present

## 2016-12-07 DIAGNOSIS — M545 Low back pain: Secondary | ICD-10-CM | POA: Diagnosis not present

## 2016-12-18 DIAGNOSIS — M545 Low back pain: Secondary | ICD-10-CM | POA: Diagnosis not present

## 2016-12-18 DIAGNOSIS — M9902 Segmental and somatic dysfunction of thoracic region: Secondary | ICD-10-CM | POA: Diagnosis not present

## 2016-12-18 DIAGNOSIS — M9903 Segmental and somatic dysfunction of lumbar region: Secondary | ICD-10-CM | POA: Diagnosis not present

## 2016-12-18 DIAGNOSIS — M9904 Segmental and somatic dysfunction of sacral region: Secondary | ICD-10-CM | POA: Diagnosis not present

## 2017-01-11 DIAGNOSIS — M9903 Segmental and somatic dysfunction of lumbar region: Secondary | ICD-10-CM | POA: Diagnosis not present

## 2017-01-11 DIAGNOSIS — M545 Low back pain: Secondary | ICD-10-CM | POA: Diagnosis not present

## 2017-01-11 DIAGNOSIS — M9902 Segmental and somatic dysfunction of thoracic region: Secondary | ICD-10-CM | POA: Diagnosis not present

## 2017-01-11 DIAGNOSIS — M9904 Segmental and somatic dysfunction of sacral region: Secondary | ICD-10-CM | POA: Diagnosis not present

## 2017-07-16 DIAGNOSIS — M25522 Pain in left elbow: Secondary | ICD-10-CM | POA: Diagnosis not present

## 2017-07-16 DIAGNOSIS — M25562 Pain in left knee: Secondary | ICD-10-CM | POA: Diagnosis not present

## 2017-07-16 DIAGNOSIS — S52122A Displaced fracture of head of left radius, initial encounter for closed fracture: Secondary | ICD-10-CM | POA: Diagnosis not present

## 2017-07-17 DIAGNOSIS — S52122D Displaced fracture of head of left radius, subsequent encounter for closed fracture with routine healing: Secondary | ICD-10-CM | POA: Diagnosis not present

## 2017-07-17 DIAGNOSIS — S52122A Displaced fracture of head of left radius, initial encounter for closed fracture: Secondary | ICD-10-CM | POA: Diagnosis not present

## 2017-07-17 DIAGNOSIS — M25562 Pain in left knee: Secondary | ICD-10-CM | POA: Diagnosis not present

## 2017-07-18 ENCOUNTER — Other Ambulatory Visit: Payer: Self-pay | Admitting: Surgery

## 2017-07-18 DIAGNOSIS — S52122A Displaced fracture of head of left radius, initial encounter for closed fracture: Secondary | ICD-10-CM

## 2017-07-23 DIAGNOSIS — S52122A Displaced fracture of head of left radius, initial encounter for closed fracture: Secondary | ICD-10-CM | POA: Diagnosis not present

## 2017-07-25 DIAGNOSIS — S52122D Displaced fracture of head of left radius, subsequent encounter for closed fracture with routine healing: Secondary | ICD-10-CM | POA: Diagnosis not present

## 2017-07-27 ENCOUNTER — Ambulatory Visit: Payer: BLUE CROSS/BLUE SHIELD | Attending: Surgery

## 2017-08-02 DIAGNOSIS — S52122D Displaced fracture of head of left radius, subsequent encounter for closed fracture with routine healing: Secondary | ICD-10-CM | POA: Diagnosis not present

## 2017-08-07 DIAGNOSIS — S52122D Displaced fracture of head of left radius, subsequent encounter for closed fracture with routine healing: Secondary | ICD-10-CM | POA: Diagnosis not present

## 2017-08-14 DIAGNOSIS — S52122D Displaced fracture of head of left radius, subsequent encounter for closed fracture with routine healing: Secondary | ICD-10-CM | POA: Diagnosis not present

## 2018-06-20 ENCOUNTER — Encounter: Payer: Self-pay | Admitting: Family Medicine

## 2018-06-20 ENCOUNTER — Ambulatory Visit (INDEPENDENT_AMBULATORY_CARE_PROVIDER_SITE_OTHER): Payer: BLUE CROSS/BLUE SHIELD | Admitting: Family Medicine

## 2018-06-20 VITALS — BP 117/78 | HR 84 | Resp 16 | Ht 66.0 in | Wt 226.1 lb

## 2018-06-20 DIAGNOSIS — F321 Major depressive disorder, single episode, moderate: Secondary | ICD-10-CM

## 2018-06-20 DIAGNOSIS — Z7689 Persons encountering health services in other specified circumstances: Secondary | ICD-10-CM | POA: Diagnosis not present

## 2018-06-20 MED ORDER — SERTRALINE HCL 25 MG PO TABS: 25.0000 mg | ORAL_TABLET | Freq: Every day | ORAL | 1 refills | Status: DC

## 2018-06-20 NOTE — Progress Notes (Signed)
Date:  06/20/2018   Name:  Alexandra Massey   DOB:  1996/05/23   MRN:  [id number]   Chief Complaint: Establish Care (Depression/Anxiety issues follows therapist [facility] in [place] )  Patient is a 23 year old female who presents for anestablish with new physician exam. The patient reports the following problems: depression. Health maintenance has been reviewed see list.  Depression         This is a chronic problem.  The current episode started more than 1 year ago.   The onset quality is undetermined.   The problem occurs intermittently.  The problem has been gradually improving since onset.  Associated symptoms include decreased concentration, fatigue, helplessness, hopelessness, insomnia, irritable, restlessness, decreased interest, appetite change and sad.  Associated symptoms include no body aches, no myalgias, no headaches, no indigestion and no suicidal ideas.( No plans/ too "chickenshit")     Exacerbated by: therapy.  Past treatments include SSRIs - Selective serotonin reuptake inhibitors.  Compliance with treatment is good.  Past compliance problems include difficulty with treatment plan.  Previous treatment provided mild relief.   Review of Systems  Constitutional: Positive for appetite change and fatigue. Negative for chills, fever and unexpected weight change.  HENT: Negative for congestion, ear discharge, ear pain, rhinorrhea, sinus pressure, sneezing and sore throat.   Eyes: Negative for photophobia, pain, discharge, redness and itching.  Respiratory: Negative for cough, shortness of breath, wheezing and stridor.   Gastrointestinal: Negative for abdominal pain, blood in stool, constipation, diarrhea, nausea and vomiting.  Endocrine: Negative for cold intolerance, heat intolerance, polydipsia, polyphagia and polyuria.  Genitourinary: Negative for dysuria, flank pain, frequency, hematuria, menstrual problem, pelvic pain, urgency, vaginal bleeding and vaginal discharge.    Musculoskeletal: Negative for arthralgias, back pain and myalgias.  Skin: Negative for rash.  Allergic/Immunologic: Negative for environmental allergies and food allergies.  Neurological: Negative for dizziness, weakness, light-headedness, numbness and headaches.  Hematological: Negative for adenopathy. Does not bruise/bleed easily.  Psychiatric/Behavioral: Positive for decreased concentration and depression. Negative for dysphoric mood and suicidal ideas. The patient has insomnia. The patient is not nervous/anxious.     Patient Active Problem List   Diagnosis Date Noted  . Chronic low back pain 08/04/2015  . 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 KNEE ARTHROSCOPY Right 2009   hperextended, went in and cleaned it out  . WISDOM TOOTH EXTRACTION      Social History   Tobacco Use  . Smoking status: Former Smoker    Packs/day: 0.50    Years: 2.00    Pack years: 1.00    Types: Cigarettes  . Smokeless tobacco: Never Used  Substance Use Topics  . Alcohol use: Yes    Alcohol/week: 0.0 standard drinks    Comment: Socially   . Drug use: Yes    Frequency: 2.0 times per week    Types: Marijuana    Comment: uses for depression      Medication list has been reviewed and updated.  Current Meds  Medication Sig  . levonorgestrel (MIRENA) 20 MCG/24HR IUD 1 each by Intrauterine route once.  . valACYclovir (VALTREX) 1000 MG tablet Take 1 tablet (1,000 mg total) by mouth 2 (two) times daily.    PHQ 2/9 Scores 06/20/2018  PHQ - 2 Score 6  PHQ- 9 Score 20    Physical Exam Vitals signs and nursing note reviewed.  Constitutional:  General: She is irritable. She is not in acute distress.    Appearance: She is not diaphoretic.  HENT:     Head: Normocephalic and atraumatic.     Right Ear: External ear normal.     Left Ear: External ear normal.     Nose: Nose normal.  Eyes:     General:         Right eye: No discharge.        Left eye: No discharge.     Conjunctiva/sclera: Conjunctivae normal.     Pupils: Pupils are equal, round, and reactive to light.  Neck:     Musculoskeletal: Normal range of motion and neck supple.     Thyroid: No thyromegaly.     Vascular: No carotid bruit or JVD.  Cardiovascular:     Rate and Rhythm: Normal rate and regular rhythm.     Heart sounds: Normal heart sounds. No murmur. No friction rub. No gallop.   Pulmonary:     Effort: Pulmonary effort is normal.     Breath sounds: Normal breath sounds.  Abdominal:     General: Bowel sounds are normal.     Palpations: Abdomen is soft. There is no mass.     Tenderness: There is no abdominal tenderness. There is no guarding.  Musculoskeletal: Normal range of motion.  Lymphadenopathy:     Cervical: No cervical adenopathy.  Skin:    General: Skin is warm and dry.  Neurological:     Mental Status: She is alert.     Deep Tendon Reflexes: Reflexes are normal and symmetric.     BP 117/78   Pulse 84   Resp 16   Ht 5\' 6"  (1.676 m)   Wt 226 lb 1.6 oz (102.6 kg)   LMP 03/20/2018 (Approximate) Comment: irregular   SpO2 99%   BMI 36.49 kg/m   Assessment and Plan: 1. Establishing care with new doctor, encounter for Patient establish care with new physician.  History and physical exam was done.  2. Current moderate episode of major depressive disorder, unspecified whether recurrent (HCC) Patient has major depressive disorder with anxiety/PTSD history.  Initiate Lane 25 mg once a day and is currently seeing a therapist that is a psychologist and medication will be reviewed with her prior to administration.

## 2018-06-28 ENCOUNTER — Other Ambulatory Visit: Payer: Self-pay

## 2018-06-28 DIAGNOSIS — F321 Major depressive disorder, single episode, moderate: Secondary | ICD-10-CM

## 2018-06-28 MED ORDER — SERTRALINE HCL 25 MG PO TABS: 25.0000 mg | ORAL_TABLET | Freq: Every day | ORAL | 1 refills | Status: DC

## 2018-08-15 ENCOUNTER — Encounter: Payer: Self-pay | Admitting: Family Medicine

## 2018-08-15 ENCOUNTER — Other Ambulatory Visit: Payer: Self-pay

## 2018-08-15 ENCOUNTER — Ambulatory Visit (INDEPENDENT_AMBULATORY_CARE_PROVIDER_SITE_OTHER): Payer: BLUE CROSS/BLUE SHIELD | Admitting: Family Medicine

## 2018-08-15 VITALS — BP 120/62 | HR 64 | Ht 66.0 in | Wt 226.0 lb

## 2018-08-15 DIAGNOSIS — F321 Major depressive disorder, single episode, moderate: Secondary | ICD-10-CM

## 2018-08-15 MED ORDER — SERTRALINE HCL 25 MG PO TABS: 25.0000 mg | ORAL_TABLET | Freq: Every day | ORAL | 5 refills | Status: DC

## 2018-08-15 NOTE — Progress Notes (Signed)
Date:  08/15/2018   Name:  Alexandra Massey   DOB:  [birth year]   MRN:  [id number]   Chief Complaint: Depression (PHQ9=5 down from 20)  Depression         This is a chronic problem.  The current episode started more than 1 year ago.   The onset quality is gradual.   The problem occurs daily.  The problem has been gradually improving since onset.  Associated symptoms include no decreased concentration, no fatigue, no helplessness, no hopelessness, does not have insomnia, not irritable, no restlessness, no decreased interest, no appetite change, no body aches, no myalgias, no headaches, no indigestion, not sad and no suicidal ideas.( OCD)  Past treatments include SSRIs - Selective serotonin reuptake inhibitors.  Compliance with treatment is variable.   Review of Systems  Constitutional: Negative.  Negative for appetite change, chills, fatigue, fever and unexpected weight change.  HENT: Negative for congestion, ear discharge, ear pain, rhinorrhea, sinus pressure, sneezing and sore throat.   Eyes: Negative for photophobia, pain, discharge, redness and itching.  Respiratory: Negative for cough, shortness of breath, wheezing and stridor.   Gastrointestinal: Negative for abdominal pain, blood in stool, constipation, diarrhea, nausea and vomiting.  Endocrine: Negative for cold intolerance, heat intolerance, polydipsia, polyphagia and polyuria.  Genitourinary: Negative for dysuria, flank pain, frequency, hematuria, menstrual problem, pelvic pain, urgency, vaginal bleeding and vaginal discharge.  Musculoskeletal: Negative for arthralgias, back pain and myalgias.  Skin: Negative for rash.  Allergic/Immunologic: Negative for environmental allergies and food allergies.  Neurological: Negative for dizziness, weakness, light-headedness, numbness and headaches.  Hematological: Negative for adenopathy. Does not bruise/bleed easily.  Psychiatric/Behavioral: Positive for depression. Negative for decreased  concentration, dysphoric mood and suicidal ideas. The patient is not nervous/anxious and does not have insomnia.     Patient Active Problem List   Diagnosis Date Noted  . Chronic low back pain 08/04/2015  . 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 KNEE ARTHROSCOPY Right 2009   hperextended, went in and cleaned it out  . WISDOM TOOTH EXTRACTION      Social History   Tobacco Use  . Smoking status: Former Smoker    Packs/day: 0.50    Years: 2.00    Pack years: 1.00    Types: Cigarettes  . Smokeless tobacco: Never Used  Substance Use Topics  . Alcohol use: Yes    Alcohol/week: 0.0 standard drinks    Comment: Socially   . Drug use: Yes    Frequency: 2.0 times per week    Types: Marijuana    Comment: uses for depression      Medication list has been reviewed and updated.  Current Meds  Medication Sig  . levonorgestrel (MIRENA) 20 MCG/24HR IUD 1 each by Intrauterine route once.  . sertraline (ZOLOFT) 25 MG tablet Take 1 tablet (25 mg total) by mouth daily.  . valACYclovir (VALTREX) 1000 MG tablet Take 1 tablet (1,000 mg total) by mouth 2 (two) times daily.    PHQ 2/9 Scores 08/15/2018 06/20/2018  PHQ - 2 Score 1 6  PHQ- 9 Score 5 20    Physical Exam Vitals signs and nursing note reviewed.  Constitutional:      General: She is not irritable.She is not in acute distress.    Appearance: She is not diaphoretic.  HENT:     Head: Normocephalic and atraumatic.     Right  Ear: Tympanic membrane, ear canal and external ear normal. There is no impacted cerumen.     Left Ear: Tympanic membrane, ear canal and external ear normal. There is no impacted cerumen.     Nose: Nose normal. No congestion or rhinorrhea.  Eyes:     General:        Right eye: No discharge.        Left eye: No discharge.     Conjunctiva/sclera: Conjunctivae normal.     Pupils: Pupils are equal, round, and reactive  to light.  Neck:     Musculoskeletal: Normal range of motion and neck supple.     Thyroid: No thyromegaly.     Vascular: No JVD.  Cardiovascular:     Rate and Rhythm: Normal rate and regular rhythm.     Heart sounds: Normal heart sounds. No murmur. No friction rub. No gallop.   Pulmonary:     Effort: Pulmonary effort is normal. No respiratory distress.     Breath sounds: Normal breath sounds. No stridor. No wheezing, rhonchi or rales.  Chest:     Chest wall: No tenderness.  Abdominal:     General: Bowel sounds are normal.     Palpations: Abdomen is soft. There is no mass.     Tenderness: There is no abdominal tenderness. There is no guarding.  Musculoskeletal: Normal range of motion.  Lymphadenopathy:     Cervical: No cervical adenopathy.  Skin:    General: Skin is warm and dry.     Capillary Refill: Capillary refill takes less than 2 seconds.  Neurological:     Mental Status: She is alert.     Deep Tendon Reflexes: Reflexes are normal and symmetric.     Wt Readings from Last 3 Encounters:  08/15/18 226 lb (102.5 kg)  06/20/18 226 lb 1.6 oz (102.6 kg)  05/05/16 217 lb (98.4 kg)    BP 120/62   Pulse 64   Ht 5\' 6"  (1.676 m)   Wt 226 lb (102.5 kg)   BMI 36.48 kg/m   Assessment and Plan: 1. Current moderate episode of major depressive disorder, unspecified whether recurrent (HCC) Chronic.  Proved.  But uncertain if controlled.  And wishes to stay at current dosing of sertraline at 25 mg a day.  Patient will continue with her therapist and has been instructed my next step would be to double up to 50 if it is suggested that she may need to increase her dose that she has that opportunity. - sertraline (ZOLOFT) 25 MG tablet; Take 1 tablet (25 mg total) by mouth daily.  Dispense: 30 tablet; Refill: 5

## 2018-09-23 DIAGNOSIS — Z3202 Encounter for pregnancy test, result negative: Secondary | ICD-10-CM | POA: Diagnosis not present

## 2018-09-23 DIAGNOSIS — Z01419 Encounter for gynecological examination (general) (routine) without abnormal findings: Secondary | ICD-10-CM | POA: Diagnosis not present

## 2018-09-23 DIAGNOSIS — Z6836 Body mass index (BMI) 36.0-36.9, adult: Secondary | ICD-10-CM | POA: Diagnosis not present

## 2018-09-23 DIAGNOSIS — Z30433 Encounter for removal and reinsertion of intrauterine contraceptive device: Secondary | ICD-10-CM | POA: Diagnosis not present

## 2018-09-23 DIAGNOSIS — Z113 Encounter for screening for infections with a predominantly sexual mode of transmission: Secondary | ICD-10-CM | POA: Diagnosis not present

## 2018-09-23 DIAGNOSIS — Z118 Encounter for screening for other infectious and parasitic diseases: Secondary | ICD-10-CM | POA: Diagnosis not present

## 2018-10-29 DIAGNOSIS — Z30431 Encounter for routine checking of intrauterine contraceptive device: Secondary | ICD-10-CM | POA: Diagnosis not present

## 2019-02-18 ENCOUNTER — Encounter: Payer: Self-pay | Admitting: Family Medicine

## 2019-02-18 ENCOUNTER — Ambulatory Visit (INDEPENDENT_AMBULATORY_CARE_PROVIDER_SITE_OTHER): Payer: BC Managed Care – PPO | Admitting: Family Medicine

## 2019-02-18 ENCOUNTER — Other Ambulatory Visit: Payer: Self-pay

## 2019-02-18 VITALS — BP 122/74 | HR 77 | Ht 66.0 in | Wt 206.0 lb

## 2019-02-18 DIAGNOSIS — F325 Major depressive disorder, single episode, in full remission: Secondary | ICD-10-CM | POA: Diagnosis not present

## 2019-02-18 NOTE — Progress Notes (Signed)
Date:  02/18/2019   Name:  Alexandra Massey   DOB:  Nov 23, 1995   MRN:  [id number]   Chief Complaint: Depression (PHQ9=4 and GAD7=4)  Depression        This is a chronic problem.  The current episode started more than 1 month ago.   The problem occurs every several days.  The problem has been gradually improving since onset.  Associated symptoms include irritable.  Associated symptoms include no decreased concentration, no fatigue, no helplessness, no hopelessness, does not have insomnia, no restlessness, no decreased interest, no appetite change, no body aches, no myalgias, no headaches, no indigestion, not sad and no suicidal ideas.  Past treatments include SSRIs - Selective serotonin reuptake inhibitors and psychotherapy.  Compliance with treatment is good.   Review of Systems  Constitutional: Negative.  Negative for appetite change, chills, fatigue, fever and unexpected weight change.  HENT: Negative for congestion, ear discharge, ear pain, rhinorrhea, sinus pressure, sneezing and sore throat.   Eyes: Negative for photophobia, pain, discharge, redness and itching.  Respiratory: Negative for cough, shortness of breath, wheezing and stridor.   Gastrointestinal: Negative for abdominal pain, blood in stool, constipation, diarrhea, nausea and vomiting.  Endocrine: Negative for cold intolerance, heat intolerance, polydipsia, polyphagia and polyuria.  Genitourinary: Negative for dysuria, flank pain, frequency, hematuria, menstrual problem, pelvic pain, urgency, vaginal bleeding and vaginal discharge.  Musculoskeletal: Negative for arthralgias, back pain and myalgias.  Skin: Negative for rash.  Allergic/Immunologic: Negative for environmental allergies and food allergies.  Neurological: Negative for dizziness, weakness, light-headedness, numbness and headaches.  Hematological: Negative for adenopathy. Does not bruise/bleed easily.  Psychiatric/Behavioral: Positive for depression. Negative  for decreased concentration, dysphoric mood and suicidal ideas. The patient is not nervous/anxious and does not have insomnia.     Patient Active Problem List   Diagnosis Date Noted  . Chronic low back pain 08/04/2015  . 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 KNEE ARTHROSCOPY Right 2009   hperextended, went in and cleaned it out  . WISDOM TOOTH EXTRACTION      Social History   Tobacco Use  . Smoking status: Former Smoker    Packs/day: 0.50    Years: 2.00    Pack years: 1.00    Types: Cigarettes  . Smokeless tobacco: Never Used  Substance Use Topics  . Alcohol use: Yes    Alcohol/week: 0.0 standard drinks    Comment: Socially   . Drug use: Yes    Frequency: 2.0 times per week    Types: Marijuana    Comment: uses for depression      Medication list has been reviewed and updated.  Current Meds  Medication Sig  . levonorgestrel (MIRENA) 20 MCG/24HR IUD 1 each by Intrauterine route once.  . sertraline (ZOLOFT) 25 MG tablet Take 1 tablet (25 mg total) by mouth daily.  . valACYclovir (VALTREX) 1000 MG tablet Take 1 tablet (1,000 mg total) by mouth 2 (two) times daily.    PHQ 2/9 Scores 02/18/2019 08/15/2018 06/20/2018  PHQ - 2 Score 0 1 6  PHQ- 9 Score 4 5 20     BP Readings from Last 3 Encounters:  02/18/19 122/74  08/15/18 120/62  06/20/18 117/78    Physical Exam Vitals signs and nursing note reviewed.  Constitutional:      General: She is irritable.     Appearance: She is well-developed.  HENT:  Head: Normocephalic.     Right Ear: External ear normal.     Left Ear: External ear normal.  Eyes:     General: Lids are everted, no foreign bodies appreciated. No scleral icterus.       Left eye: No foreign body or hordeolum.     Conjunctiva/sclera: Conjunctivae normal.     Right eye: Right conjunctiva is not injected.     Left eye: Left conjunctiva is not injected.      Pupils: Pupils are equal, round, and reactive to light.  Neck:     Musculoskeletal: Normal range of motion and neck supple.     Thyroid: No thyromegaly.     Vascular: No JVD.     Trachea: No tracheal deviation.  Cardiovascular:     Rate and Rhythm: Normal rate and regular rhythm.     Heart sounds: Normal heart sounds. No murmur. No friction rub. No gallop.   Pulmonary:     Effort: Pulmonary effort is normal. No respiratory distress.     Breath sounds: Normal breath sounds. No wheezing or rales.  Abdominal:     General: Bowel sounds are normal.     Palpations: Abdomen is soft. There is no mass.     Tenderness: There is no abdominal tenderness. There is no guarding or rebound.  Musculoskeletal: Normal range of motion.        General: No tenderness.  Lymphadenopathy:     Cervical: No cervical adenopathy.  Skin:    General: Skin is warm.     Findings: No rash.  Neurological:     Mental Status: She is alert and oriented to person, place, and time.     Cranial Nerves: No cranial nerve deficit.     Deep Tendon Reflexes: Reflexes normal.  Psychiatric:        Mood and Affect: Mood is not anxious or depressed.     Wt Readings from Last 3 Encounters:  02/18/19 206 lb (93.4 kg)  08/15/18 226 lb (102.5 kg)  06/20/18 226 lb 1.6 oz (102.6 kg)    BP 122/74   Pulse 77   Ht 5\' 6"  (1.676 m)   Wt 206 lb (93.4 kg)   SpO2 98%   BMI 33.25 kg/m    Assessment and Plan: 1. Major depressive disorder in full remission, unspecified whether recurrent (HCC)  PHQ score 4/gad score 4.  Patient improved on sertraline and then weaned off and she is doing well patient is undergoing psychotherapy with a therapist which is helped her tremendously.  We will continue off medication with the understanding that we may resume if needed.  In the meantime patient will continue with her outpatient therapy.

## 2019-02-18 NOTE — Patient Instructions (Signed)

## 2019-03-14 DIAGNOSIS — M9902 Segmental and somatic dysfunction of thoracic region: Secondary | ICD-10-CM | POA: Diagnosis not present

## 2019-03-14 DIAGNOSIS — M9904 Segmental and somatic dysfunction of sacral region: Secondary | ICD-10-CM | POA: Diagnosis not present

## 2019-03-14 DIAGNOSIS — M9903 Segmental and somatic dysfunction of lumbar region: Secondary | ICD-10-CM | POA: Diagnosis not present

## 2019-03-14 DIAGNOSIS — M5442 Lumbago with sciatica, left side: Secondary | ICD-10-CM | POA: Diagnosis not present

## 2019-03-24 DIAGNOSIS — M5442 Lumbago with sciatica, left side: Secondary | ICD-10-CM | POA: Diagnosis not present

## 2019-03-24 DIAGNOSIS — M9903 Segmental and somatic dysfunction of lumbar region: Secondary | ICD-10-CM | POA: Diagnosis not present

## 2019-03-24 DIAGNOSIS — M9902 Segmental and somatic dysfunction of thoracic region: Secondary | ICD-10-CM | POA: Diagnosis not present

## 2019-03-24 DIAGNOSIS — M9904 Segmental and somatic dysfunction of sacral region: Secondary | ICD-10-CM | POA: Diagnosis not present

## 2019-04-11 DIAGNOSIS — M5442 Lumbago with sciatica, left side: Secondary | ICD-10-CM | POA: Diagnosis not present

## 2019-04-11 DIAGNOSIS — M9903 Segmental and somatic dysfunction of lumbar region: Secondary | ICD-10-CM | POA: Diagnosis not present

## 2019-04-11 DIAGNOSIS — M9902 Segmental and somatic dysfunction of thoracic region: Secondary | ICD-10-CM | POA: Diagnosis not present

## 2019-05-22 DIAGNOSIS — M545 Low back pain: Secondary | ICD-10-CM | POA: Diagnosis not present

## 2019-05-29 DIAGNOSIS — R1031 Right lower quadrant pain: Secondary | ICD-10-CM | POA: Diagnosis not present

## 2019-05-29 DIAGNOSIS — F17210 Nicotine dependence, cigarettes, uncomplicated: Secondary | ICD-10-CM | POA: Diagnosis not present

## 2019-05-29 DIAGNOSIS — R109 Unspecified abdominal pain: Secondary | ICD-10-CM | POA: Diagnosis not present

## 2019-05-29 DIAGNOSIS — N12 Tubulo-interstitial nephritis, not specified as acute or chronic: Secondary | ICD-10-CM | POA: Diagnosis not present

## 2019-05-29 DIAGNOSIS — F1299 Cannabis use, unspecified with unspecified cannabis-induced disorder: Secondary | ICD-10-CM | POA: Diagnosis not present

## 2019-05-29 DIAGNOSIS — R112 Nausea with vomiting, unspecified: Secondary | ICD-10-CM | POA: Diagnosis not present

## 2019-06-04 DIAGNOSIS — M545 Low back pain: Secondary | ICD-10-CM | POA: Diagnosis not present

## 2019-06-12 DIAGNOSIS — M545 Low back pain: Secondary | ICD-10-CM | POA: Diagnosis not present

## 2019-06-19 DIAGNOSIS — M545 Low back pain: Secondary | ICD-10-CM | POA: Diagnosis not present

## 2019-06-26 DIAGNOSIS — M545 Low back pain: Secondary | ICD-10-CM | POA: Diagnosis not present

## 2019-06-27 DIAGNOSIS — M545 Low back pain: Secondary | ICD-10-CM | POA: Diagnosis not present

## 2019-07-02 DIAGNOSIS — M545 Low back pain: Secondary | ICD-10-CM | POA: Diagnosis not present

## 2019-07-09 DIAGNOSIS — M545 Low back pain: Secondary | ICD-10-CM | POA: Diagnosis not present

## 2019-07-18 DIAGNOSIS — M545 Low back pain: Secondary | ICD-10-CM | POA: Diagnosis not present

## 2019-08-06 DIAGNOSIS — M545 Low back pain: Secondary | ICD-10-CM | POA: Diagnosis not present

## 2019-08-13 DIAGNOSIS — M545 Low back pain: Secondary | ICD-10-CM | POA: Diagnosis not present

## 2019-08-20 DIAGNOSIS — M545 Low back pain: Secondary | ICD-10-CM | POA: Diagnosis not present

## 2019-08-27 DIAGNOSIS — M545 Low back pain: Secondary | ICD-10-CM | POA: Diagnosis not present

## 2019-09-03 DIAGNOSIS — M545 Low back pain: Secondary | ICD-10-CM | POA: Diagnosis not present

## 2019-09-11 DIAGNOSIS — M545 Low back pain: Secondary | ICD-10-CM | POA: Diagnosis not present

## 2019-09-17 DIAGNOSIS — M545 Low back pain: Secondary | ICD-10-CM | POA: Diagnosis not present

## 2019-10-01 DIAGNOSIS — M545 Low back pain: Secondary | ICD-10-CM | POA: Diagnosis not present

## 2019-10-29 DIAGNOSIS — M545 Low back pain: Secondary | ICD-10-CM | POA: Diagnosis not present

## 2019-11-05 DIAGNOSIS — M545 Low back pain: Secondary | ICD-10-CM | POA: Diagnosis not present

## 2019-12-10 DIAGNOSIS — M545 Low back pain: Secondary | ICD-10-CM | POA: Diagnosis not present

## 2019-12-22 DIAGNOSIS — S336XXA Sprain of sacroiliac joint, initial encounter: Secondary | ICD-10-CM | POA: Diagnosis not present

## 2019-12-22 DIAGNOSIS — S9002XA Contusion of left ankle, initial encounter: Secondary | ICD-10-CM | POA: Diagnosis not present

## 2020-06-03 DIAGNOSIS — M9902 Segmental and somatic dysfunction of thoracic region: Secondary | ICD-10-CM | POA: Diagnosis not present

## 2020-06-03 DIAGNOSIS — M9903 Segmental and somatic dysfunction of lumbar region: Secondary | ICD-10-CM | POA: Diagnosis not present

## 2020-06-03 DIAGNOSIS — M62838 Other muscle spasm: Secondary | ICD-10-CM | POA: Diagnosis not present

## 2020-06-03 DIAGNOSIS — M9901 Segmental and somatic dysfunction of cervical region: Secondary | ICD-10-CM | POA: Diagnosis not present

## 2020-06-29 DIAGNOSIS — M62838 Other muscle spasm: Secondary | ICD-10-CM | POA: Diagnosis not present

## 2020-06-29 DIAGNOSIS — M9901 Segmental and somatic dysfunction of cervical region: Secondary | ICD-10-CM | POA: Diagnosis not present

## 2020-06-29 DIAGNOSIS — M9902 Segmental and somatic dysfunction of thoracic region: Secondary | ICD-10-CM | POA: Diagnosis not present

## 2020-06-29 DIAGNOSIS — M9903 Segmental and somatic dysfunction of lumbar region: Secondary | ICD-10-CM | POA: Diagnosis not present

## 2020-07-01 DIAGNOSIS — M9902 Segmental and somatic dysfunction of thoracic region: Secondary | ICD-10-CM | POA: Diagnosis not present

## 2020-07-01 DIAGNOSIS — M62838 Other muscle spasm: Secondary | ICD-10-CM | POA: Diagnosis not present

## 2020-07-01 DIAGNOSIS — M9903 Segmental and somatic dysfunction of lumbar region: Secondary | ICD-10-CM | POA: Diagnosis not present

## 2020-07-01 DIAGNOSIS — M9901 Segmental and somatic dysfunction of cervical region: Secondary | ICD-10-CM | POA: Diagnosis not present

## 2020-07-06 DIAGNOSIS — M9902 Segmental and somatic dysfunction of thoracic region: Secondary | ICD-10-CM | POA: Diagnosis not present

## 2020-07-06 DIAGNOSIS — M9903 Segmental and somatic dysfunction of lumbar region: Secondary | ICD-10-CM | POA: Diagnosis not present

## 2020-07-06 DIAGNOSIS — M9901 Segmental and somatic dysfunction of cervical region: Secondary | ICD-10-CM | POA: Diagnosis not present

## 2020-07-06 DIAGNOSIS — M62838 Other muscle spasm: Secondary | ICD-10-CM | POA: Diagnosis not present

## 2020-07-08 DIAGNOSIS — M9902 Segmental and somatic dysfunction of thoracic region: Secondary | ICD-10-CM | POA: Diagnosis not present

## 2020-07-08 DIAGNOSIS — M9903 Segmental and somatic dysfunction of lumbar region: Secondary | ICD-10-CM | POA: Diagnosis not present

## 2020-07-08 DIAGNOSIS — M9901 Segmental and somatic dysfunction of cervical region: Secondary | ICD-10-CM | POA: Diagnosis not present

## 2020-07-08 DIAGNOSIS — M62838 Other muscle spasm: Secondary | ICD-10-CM | POA: Diagnosis not present

## 2020-07-13 DIAGNOSIS — M62838 Other muscle spasm: Secondary | ICD-10-CM | POA: Diagnosis not present

## 2020-07-13 DIAGNOSIS — M9903 Segmental and somatic dysfunction of lumbar region: Secondary | ICD-10-CM | POA: Diagnosis not present

## 2020-07-13 DIAGNOSIS — M9901 Segmental and somatic dysfunction of cervical region: Secondary | ICD-10-CM | POA: Diagnosis not present

## 2020-07-13 DIAGNOSIS — M9902 Segmental and somatic dysfunction of thoracic region: Secondary | ICD-10-CM | POA: Diagnosis not present

## 2020-07-15 DIAGNOSIS — M9902 Segmental and somatic dysfunction of thoracic region: Secondary | ICD-10-CM | POA: Diagnosis not present

## 2020-07-15 DIAGNOSIS — M62838 Other muscle spasm: Secondary | ICD-10-CM | POA: Diagnosis not present

## 2020-07-15 DIAGNOSIS — M9901 Segmental and somatic dysfunction of cervical region: Secondary | ICD-10-CM | POA: Diagnosis not present

## 2020-07-15 DIAGNOSIS — M9903 Segmental and somatic dysfunction of lumbar region: Secondary | ICD-10-CM | POA: Diagnosis not present

## 2020-07-20 DIAGNOSIS — M9903 Segmental and somatic dysfunction of lumbar region: Secondary | ICD-10-CM | POA: Diagnosis not present

## 2020-07-20 DIAGNOSIS — M9902 Segmental and somatic dysfunction of thoracic region: Secondary | ICD-10-CM | POA: Diagnosis not present

## 2020-07-20 DIAGNOSIS — M9901 Segmental and somatic dysfunction of cervical region: Secondary | ICD-10-CM | POA: Diagnosis not present

## 2020-07-20 DIAGNOSIS — M62838 Other muscle spasm: Secondary | ICD-10-CM | POA: Diagnosis not present

## 2020-07-22 DIAGNOSIS — M9902 Segmental and somatic dysfunction of thoracic region: Secondary | ICD-10-CM | POA: Diagnosis not present

## 2020-07-22 DIAGNOSIS — M9901 Segmental and somatic dysfunction of cervical region: Secondary | ICD-10-CM | POA: Diagnosis not present

## 2020-07-22 DIAGNOSIS — M9903 Segmental and somatic dysfunction of lumbar region: Secondary | ICD-10-CM | POA: Diagnosis not present

## 2020-07-22 DIAGNOSIS — M62838 Other muscle spasm: Secondary | ICD-10-CM | POA: Diagnosis not present

## 2020-07-27 DIAGNOSIS — M9902 Segmental and somatic dysfunction of thoracic region: Secondary | ICD-10-CM | POA: Diagnosis not present

## 2020-07-27 DIAGNOSIS — M9903 Segmental and somatic dysfunction of lumbar region: Secondary | ICD-10-CM | POA: Diagnosis not present

## 2020-07-27 DIAGNOSIS — M9901 Segmental and somatic dysfunction of cervical region: Secondary | ICD-10-CM | POA: Diagnosis not present

## 2020-07-27 DIAGNOSIS — M62838 Other muscle spasm: Secondary | ICD-10-CM | POA: Diagnosis not present

## 2020-07-29 DIAGNOSIS — M9902 Segmental and somatic dysfunction of thoracic region: Secondary | ICD-10-CM | POA: Diagnosis not present

## 2020-07-29 DIAGNOSIS — M9901 Segmental and somatic dysfunction of cervical region: Secondary | ICD-10-CM | POA: Diagnosis not present

## 2020-07-29 DIAGNOSIS — M9903 Segmental and somatic dysfunction of lumbar region: Secondary | ICD-10-CM | POA: Diagnosis not present

## 2020-07-29 DIAGNOSIS — M62838 Other muscle spasm: Secondary | ICD-10-CM | POA: Diagnosis not present

## 2020-08-03 DIAGNOSIS — M9902 Segmental and somatic dysfunction of thoracic region: Secondary | ICD-10-CM | POA: Diagnosis not present

## 2020-08-03 DIAGNOSIS — M62838 Other muscle spasm: Secondary | ICD-10-CM | POA: Diagnosis not present

## 2020-08-03 DIAGNOSIS — M9903 Segmental and somatic dysfunction of lumbar region: Secondary | ICD-10-CM | POA: Diagnosis not present

## 2020-08-03 DIAGNOSIS — M9901 Segmental and somatic dysfunction of cervical region: Secondary | ICD-10-CM | POA: Diagnosis not present

## 2020-08-05 DIAGNOSIS — M9901 Segmental and somatic dysfunction of cervical region: Secondary | ICD-10-CM | POA: Diagnosis not present

## 2020-08-05 DIAGNOSIS — M62838 Other muscle spasm: Secondary | ICD-10-CM | POA: Diagnosis not present

## 2020-08-05 DIAGNOSIS — M9903 Segmental and somatic dysfunction of lumbar region: Secondary | ICD-10-CM | POA: Diagnosis not present

## 2020-08-05 DIAGNOSIS — M9902 Segmental and somatic dysfunction of thoracic region: Secondary | ICD-10-CM | POA: Diagnosis not present

## 2020-08-10 DIAGNOSIS — M9902 Segmental and somatic dysfunction of thoracic region: Secondary | ICD-10-CM | POA: Diagnosis not present

## 2020-08-10 DIAGNOSIS — M62838 Other muscle spasm: Secondary | ICD-10-CM | POA: Diagnosis not present

## 2020-08-10 DIAGNOSIS — M9903 Segmental and somatic dysfunction of lumbar region: Secondary | ICD-10-CM | POA: Diagnosis not present

## 2020-08-10 DIAGNOSIS — M9901 Segmental and somatic dysfunction of cervical region: Secondary | ICD-10-CM | POA: Diagnosis not present

## 2020-08-12 DIAGNOSIS — M9903 Segmental and somatic dysfunction of lumbar region: Secondary | ICD-10-CM | POA: Diagnosis not present

## 2020-08-12 DIAGNOSIS — M9902 Segmental and somatic dysfunction of thoracic region: Secondary | ICD-10-CM | POA: Diagnosis not present

## 2020-08-12 DIAGNOSIS — M9901 Segmental and somatic dysfunction of cervical region: Secondary | ICD-10-CM | POA: Diagnosis not present

## 2020-08-12 DIAGNOSIS — M62838 Other muscle spasm: Secondary | ICD-10-CM | POA: Diagnosis not present

## 2020-08-24 DIAGNOSIS — M62838 Other muscle spasm: Secondary | ICD-10-CM | POA: Diagnosis not present

## 2020-08-24 DIAGNOSIS — M9901 Segmental and somatic dysfunction of cervical region: Secondary | ICD-10-CM | POA: Diagnosis not present

## 2020-08-24 DIAGNOSIS — M9902 Segmental and somatic dysfunction of thoracic region: Secondary | ICD-10-CM | POA: Diagnosis not present

## 2020-08-24 DIAGNOSIS — M9903 Segmental and somatic dysfunction of lumbar region: Secondary | ICD-10-CM | POA: Diagnosis not present

## 2020-09-02 DIAGNOSIS — M9903 Segmental and somatic dysfunction of lumbar region: Secondary | ICD-10-CM | POA: Diagnosis not present

## 2020-09-02 DIAGNOSIS — M9902 Segmental and somatic dysfunction of thoracic region: Secondary | ICD-10-CM | POA: Diagnosis not present

## 2020-09-02 DIAGNOSIS — M9901 Segmental and somatic dysfunction of cervical region: Secondary | ICD-10-CM | POA: Diagnosis not present

## 2020-09-02 DIAGNOSIS — M62838 Other muscle spasm: Secondary | ICD-10-CM | POA: Diagnosis not present

## 2020-09-07 DIAGNOSIS — M9903 Segmental and somatic dysfunction of lumbar region: Secondary | ICD-10-CM | POA: Diagnosis not present

## 2020-09-07 DIAGNOSIS — M9902 Segmental and somatic dysfunction of thoracic region: Secondary | ICD-10-CM | POA: Diagnosis not present

## 2020-09-07 DIAGNOSIS — M9901 Segmental and somatic dysfunction of cervical region: Secondary | ICD-10-CM | POA: Diagnosis not present

## 2020-09-07 DIAGNOSIS — M62838 Other muscle spasm: Secondary | ICD-10-CM | POA: Diagnosis not present

## 2020-09-14 DIAGNOSIS — M9901 Segmental and somatic dysfunction of cervical region: Secondary | ICD-10-CM | POA: Diagnosis not present

## 2020-09-14 DIAGNOSIS — M62838 Other muscle spasm: Secondary | ICD-10-CM | POA: Diagnosis not present

## 2020-09-14 DIAGNOSIS — M9902 Segmental and somatic dysfunction of thoracic region: Secondary | ICD-10-CM | POA: Diagnosis not present

## 2020-09-14 DIAGNOSIS — M9903 Segmental and somatic dysfunction of lumbar region: Secondary | ICD-10-CM | POA: Diagnosis not present

## 2020-09-21 DIAGNOSIS — M9901 Segmental and somatic dysfunction of cervical region: Secondary | ICD-10-CM | POA: Diagnosis not present

## 2020-09-21 DIAGNOSIS — M9902 Segmental and somatic dysfunction of thoracic region: Secondary | ICD-10-CM | POA: Diagnosis not present

## 2020-09-21 DIAGNOSIS — M62838 Other muscle spasm: Secondary | ICD-10-CM | POA: Diagnosis not present

## 2020-09-21 DIAGNOSIS — M9903 Segmental and somatic dysfunction of lumbar region: Secondary | ICD-10-CM | POA: Diagnosis not present

## 2020-09-28 DIAGNOSIS — M9902 Segmental and somatic dysfunction of thoracic region: Secondary | ICD-10-CM | POA: Diagnosis not present

## 2020-09-28 DIAGNOSIS — M62838 Other muscle spasm: Secondary | ICD-10-CM | POA: Diagnosis not present

## 2020-09-28 DIAGNOSIS — M9903 Segmental and somatic dysfunction of lumbar region: Secondary | ICD-10-CM | POA: Diagnosis not present

## 2020-09-28 DIAGNOSIS — M9901 Segmental and somatic dysfunction of cervical region: Secondary | ICD-10-CM | POA: Diagnosis not present

## 2020-10-05 DIAGNOSIS — M9901 Segmental and somatic dysfunction of cervical region: Secondary | ICD-10-CM | POA: Diagnosis not present

## 2020-10-05 DIAGNOSIS — M9903 Segmental and somatic dysfunction of lumbar region: Secondary | ICD-10-CM | POA: Diagnosis not present

## 2020-10-05 DIAGNOSIS — M9902 Segmental and somatic dysfunction of thoracic region: Secondary | ICD-10-CM | POA: Diagnosis not present

## 2020-10-05 DIAGNOSIS — M62838 Other muscle spasm: Secondary | ICD-10-CM | POA: Diagnosis not present

## 2020-10-12 DIAGNOSIS — M62838 Other muscle spasm: Secondary | ICD-10-CM | POA: Diagnosis not present

## 2020-10-12 DIAGNOSIS — M9903 Segmental and somatic dysfunction of lumbar region: Secondary | ICD-10-CM | POA: Diagnosis not present

## 2020-10-12 DIAGNOSIS — M9902 Segmental and somatic dysfunction of thoracic region: Secondary | ICD-10-CM | POA: Diagnosis not present

## 2020-10-12 DIAGNOSIS — M9901 Segmental and somatic dysfunction of cervical region: Secondary | ICD-10-CM | POA: Diagnosis not present

## 2020-10-14 DIAGNOSIS — Z113 Encounter for screening for infections with a predominantly sexual mode of transmission: Secondary | ICD-10-CM | POA: Diagnosis not present

## 2020-10-14 DIAGNOSIS — Z30431 Encounter for routine checking of intrauterine contraceptive device: Secondary | ICD-10-CM | POA: Diagnosis not present

## 2020-10-14 DIAGNOSIS — Z01419 Encounter for gynecological examination (general) (routine) without abnormal findings: Secondary | ICD-10-CM | POA: Diagnosis not present

## 2021-12-20 ENCOUNTER — Encounter: Payer: Self-pay | Admitting: *Deleted
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
## 2021-12-23 ENCOUNTER — Encounter: Payer: Self-pay | Admitting: Psychiatry

## 2021-12-23 ENCOUNTER — Ambulatory Visit (INDEPENDENT_AMBULATORY_CARE_PROVIDER_SITE_OTHER): Payer: BC Managed Care – PPO | Admitting: Psychiatry

## 2021-12-23 ENCOUNTER — Ambulatory Visit: Payer: BC Managed Care – PPO | Admitting: Psychiatry

## 2021-12-23 VITALS — BP 109/72 | HR 63 | Ht 66.0 in

## 2021-12-23 DIAGNOSIS — M23302 Other meniscus derangements, unspecified lateral meniscus, unspecified knee: Secondary | ICD-10-CM | POA: Insufficient documentation

## 2021-12-23 DIAGNOSIS — S83006A Unspecified dislocation of unspecified patella, initial encounter: Secondary | ICD-10-CM | POA: Insufficient documentation

## 2021-12-23 DIAGNOSIS — R42 Dizziness and giddiness: Secondary | ICD-10-CM | POA: Diagnosis not present

## 2021-12-23 DIAGNOSIS — S39012A Strain of muscle, fascia and tendon of lower back, initial encounter: Secondary | ICD-10-CM | POA: Insufficient documentation

## 2021-12-23 DIAGNOSIS — S0990XA Unspecified injury of head, initial encounter: Secondary | ICD-10-CM | POA: Diagnosis not present

## 2021-12-23 DIAGNOSIS — M2240 Chondromalacia patellae, unspecified knee: Secondary | ICD-10-CM | POA: Insufficient documentation

## 2021-12-23 DIAGNOSIS — R270 Ataxia, unspecified: Secondary | ICD-10-CM | POA: Diagnosis not present

## 2021-12-23 DIAGNOSIS — M25569 Pain in unspecified knee: Secondary | ICD-10-CM | POA: Insufficient documentation

## 2021-12-23 DIAGNOSIS — S335XXA Sprain of ligaments of lumbar spine, initial encounter: Secondary | ICD-10-CM | POA: Insufficient documentation

## 2021-12-29 ENCOUNTER — Telehealth: Payer: Self-pay | Admitting: Psychiatry
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
## 2022-01-10 ENCOUNTER — Ambulatory Visit
Admission: RE | Admit: 2022-01-10 | Discharge: 2022-01-10 | Disposition: A | Discharge status: Home / Self Care | Payer: Self-pay | Source: Ambulatory Visit | Attending: Psychiatry | Admitting: Psychiatry

## 2022-01-10 ENCOUNTER — Ambulatory Visit
Admission: RE | Admit: 2022-01-10 | Discharge: 2022-01-10 | Disposition: A | Discharge status: Home / Self Care | Payer: BLUE CROSS/BLUE SHIELD | Source: Ambulatory Visit | Attending: Psychiatry | Admitting: Psychiatry

## 2022-01-10 DIAGNOSIS — S0990XA Unspecified injury of head, initial encounter: Secondary | ICD-10-CM

## 2022-01-10 MED ORDER — IOPAMIDOL (ISOVUE-370) INJECTION 76%
75.0000 mL | Freq: Once | INTRAVENOUS | Status: AC | PRN
  Administered 2022-01-10: 75 mL via INTRAVENOUS

## 2022-01-10 MED ORDER — GADOBENATE DIMEGLUMINE 529 MG/ML IV SOLN
19.0000 mL | Freq: Once | INTRAVENOUS | Status: AC | PRN
  Administered 2022-01-10: 19 mL via INTRAVENOUS

## 2022-01-19 ENCOUNTER — Telehealth: Payer: Self-pay | Admitting: Psychiatry

## 2022-01-19 DIAGNOSIS — R2689 Other abnormalities of gait and mobility: Secondary | ICD-10-CM

## 2022-01-19 NOTE — Telephone Encounter (Signed)
Contacted pt, informed her Dr.Chima sent results via Mychart. Both MRI and CT was normal. Pt was surprised it was normal and states she is still having dizziness and imbalance and wants to know next steps or whats the cause of symptoms.

## 2022-01-19 NOTE — Telephone Encounter (Signed)
Contacted pt back, went over Dr Quentin Mulling recommendations and she was agreeable to trying PT to help with her imbalance.
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
## 2022-01-19 NOTE — Telephone Encounter (Signed)
Pt called wanting to know when she will be receiving a call with her MRI results. Please advise.

## 2022-01-20 NOTE — Addendum Note (Signed)
Addended by: Ocie Doyne on: 01/20/2022 08:13 AM   Modules accepted: Orders

## 2022-01-20 NOTE — Telephone Encounter (Signed)
I placed a referral to PT for her,thanks

## 2022-01-30 ENCOUNTER — Ambulatory Visit: Payer: BC Managed Care – PPO | Attending: Psychiatry | Admitting: Physical Therapy

## 2022-01-30 ENCOUNTER — Encounter: Payer: Self-pay | Admitting: Physical Therapy

## 2022-01-30 DIAGNOSIS — R262 Difficulty in walking, not elsewhere classified: Secondary | ICD-10-CM | POA: Diagnosis present

## 2022-01-30 DIAGNOSIS — R2689 Other abnormalities of gait and mobility: Secondary | ICD-10-CM | POA: Insufficient documentation

## 2022-01-30 DIAGNOSIS — R42 Dizziness and giddiness: Secondary | ICD-10-CM | POA: Diagnosis present

## 2022-01-30 NOTE — Addendum Note (Signed)
Addended by: Gertie Exon on: 01/30/2022 02:55 PM   Modules accepted: Orders
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
## 2022-02-01 ENCOUNTER — Encounter: Payer: BC Managed Care – PPO | Admitting: Physical Therapy

## 2022-02-02 ENCOUNTER — Ambulatory Visit: Payer: BC Managed Care – PPO

## 2022-02-02 DIAGNOSIS — R262 Difficulty in walking, not elsewhere classified: Secondary | ICD-10-CM

## 2022-02-02 DIAGNOSIS — R42 Dizziness and giddiness: Secondary | ICD-10-CM

## 2022-02-02 DIAGNOSIS — R2689 Other abnormalities of gait and mobility: Secondary | ICD-10-CM | POA: Diagnosis not present

## 2022-02-08 ENCOUNTER — Encounter: Payer: Self-pay | Admitting: Physical Therapy

## 2022-02-08 ENCOUNTER — Ambulatory Visit: Payer: BC Managed Care – PPO | Attending: Psychiatry | Admitting: Physical Therapy

## 2022-02-08 DIAGNOSIS — R42 Dizziness and giddiness: Secondary | ICD-10-CM | POA: Diagnosis present

## 2022-02-08 DIAGNOSIS — R262 Difficulty in walking, not elsewhere classified: Secondary | ICD-10-CM | POA: Diagnosis present

## 2022-02-08 DIAGNOSIS — R2689 Other abnormalities of gait and mobility: Secondary | ICD-10-CM
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
## 2022-02-13 ENCOUNTER — Encounter: Payer: Self-pay | Admitting: Physical Therapy

## 2022-02-13 ENCOUNTER — Ambulatory Visit: Payer: BC Managed Care – PPO | Admitting: Physical Therapy

## 2022-02-13 DIAGNOSIS — R262 Difficulty in walking, not elsewhere classified: Secondary | ICD-10-CM

## 2022-02-13 DIAGNOSIS — R42 Dizziness and giddiness: Secondary | ICD-10-CM | POA: Diagnosis not present

## 2022-02-13 DIAGNOSIS — R2689 Other abnormalities of gait and mobility: Secondary | ICD-10-CM
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
## 2022-02-15 ENCOUNTER — Encounter: Payer: Self-pay | Admitting: Physical Therapy

## 2022-02-15 ENCOUNTER — Ambulatory Visit: Payer: BC Managed Care – PPO | Admitting: Physical Therapy

## 2022-02-15 DIAGNOSIS — R42 Dizziness and giddiness: Secondary | ICD-10-CM

## 2022-02-15 DIAGNOSIS — R262 Difficulty in walking, not elsewhere classified: Secondary | ICD-10-CM

## 2022-02-15 DIAGNOSIS — R2689 Other abnormalities of gait and mobility: Secondary | ICD-10-CM
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
## 2022-02-20 ENCOUNTER — Ambulatory Visit: Payer: BC Managed Care – PPO | Admitting: Physical Therapy

## 2022-02-22 ENCOUNTER — Ambulatory Visit: Payer: BC Managed Care – PPO | Admitting: Physical Therapy

## 2022-02-27 ENCOUNTER — Ambulatory Visit: Payer: BC Managed Care – PPO

## 2022-02-27 DIAGNOSIS — R42 Dizziness and giddiness: Secondary | ICD-10-CM | POA: Diagnosis not present

## 2022-02-27 DIAGNOSIS — R262 Difficulty in walking, not elsewhere classified: Secondary | ICD-10-CM

## 2022-02-27 DIAGNOSIS — R2689 Other abnormalities of gait and mobility: Secondary | ICD-10-CM
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
## 2022-03-01 ENCOUNTER — Ambulatory Visit: Payer: BC Managed Care – PPO

## 2022-03-01 DIAGNOSIS — R42 Dizziness and giddiness: Secondary | ICD-10-CM | POA: Diagnosis not present

## 2022-03-01 DIAGNOSIS — R2689 Other abnormalities of gait and mobility: Secondary | ICD-10-CM

## 2022-03-01 DIAGNOSIS — R262 Difficulty in walking, not elsewhere classified: Secondary | ICD-10-CM
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
## 2022-03-06 ENCOUNTER — Ambulatory Visit: Payer: BC Managed Care – PPO | Attending: Psychiatry

## 2022-03-06 DIAGNOSIS — R42 Dizziness and giddiness: Secondary | ICD-10-CM | POA: Diagnosis present

## 2022-03-06 DIAGNOSIS — R2689 Other abnormalities of gait and mobility: Secondary | ICD-10-CM | POA: Insufficient documentation

## 2022-03-06 DIAGNOSIS — R262 Difficulty in walking, not elsewhere classified: Secondary | ICD-10-CM | POA: Insufficient documentation
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
## 2022-03-08 ENCOUNTER — Ambulatory Visit: Payer: BC Managed Care – PPO | Admitting: Physical Therapy

## 2022-03-08 ENCOUNTER — Encounter: Payer: Self-pay | Admitting: Physical Therapy

## 2022-03-08 DIAGNOSIS — R42 Dizziness and giddiness: Secondary | ICD-10-CM

## 2022-03-08 DIAGNOSIS — R262 Difficulty in walking, not elsewhere classified: Secondary | ICD-10-CM

## 2022-03-08 DIAGNOSIS — R2689 Other abnormalities of gait and mobility: Secondary | ICD-10-CM

## 2022-03-13 ENCOUNTER — Ambulatory Visit: Payer: BC Managed Care – PPO | Admitting: Physical Therapy

## 2022-03-15 ENCOUNTER — Ambulatory Visit: Payer: BC Managed Care – PPO | Admitting: Physical Therapy

## 2022-03-15 DIAGNOSIS — R42 Dizziness and giddiness: Secondary | ICD-10-CM

## 2022-03-15 DIAGNOSIS — R2689 Other abnormalities of gait and mobility: Secondary | ICD-10-CM

## 2022-03-15 DIAGNOSIS — R262 Difficulty in walking, not elsewhere classified: Secondary | ICD-10-CM
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
## 2022-03-16 ENCOUNTER — Encounter: Payer: Self-pay | Admitting: Physical Therapy

## 2022-03-27 ENCOUNTER — Ambulatory Visit: Payer: BC Managed Care – PPO | Admitting: Physical Therapy

## 2022-03-27 ENCOUNTER — Encounter: Payer: Self-pay | Admitting: Physical Therapy

## 2022-03-27 DIAGNOSIS — R42 Dizziness and giddiness: Secondary | ICD-10-CM | POA: Diagnosis not present

## 2022-03-27 DIAGNOSIS — R262 Difficulty in walking, not elsewhere classified: Secondary | ICD-10-CM

## 2022-03-27 DIAGNOSIS — R2689 Other abnormalities of gait and mobility: Secondary | ICD-10-CM
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
## 2022-04-03 ENCOUNTER — Ambulatory Visit: Payer: BC Managed Care – PPO | Admitting: Physical Therapy

## 2022-04-06 ENCOUNTER — Ambulatory Visit: Payer: BC Managed Care – PPO | Attending: Psychiatry | Admitting: Physical Therapy

## 2022-04-06 DIAGNOSIS — R2689 Other abnormalities of gait and mobility: Secondary | ICD-10-CM | POA: Diagnosis present

## 2022-04-06 DIAGNOSIS — R42 Dizziness and giddiness: Secondary | ICD-10-CM | POA: Insufficient documentation

## 2022-04-06 DIAGNOSIS — R262 Difficulty in walking, not elsewhere classified: Secondary | ICD-10-CM | POA: Insufficient documentation
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
## 2022-04-09 ENCOUNTER — Encounter: Payer: Self-pay | Admitting: Physical Therapy

## 2022-04-10 ENCOUNTER — Ambulatory Visit: Payer: BC Managed Care – PPO | Admitting: Physical Therapy

## 2022-04-12 ENCOUNTER — Ambulatory Visit: Payer: BC Managed Care – PPO | Admitting: Physical Therapy

## 2022-04-17 ENCOUNTER — Ambulatory Visit: Payer: BC Managed Care – PPO | Admitting: Physical Therapy

## 2022-04-17 DIAGNOSIS — R42 Dizziness and giddiness: Secondary | ICD-10-CM | POA: Diagnosis not present

## 2022-04-17 DIAGNOSIS — R2689 Other abnormalities of gait and mobility: Secondary | ICD-10-CM

## 2022-04-17 DIAGNOSIS — R262 Difficulty in walking, not elsewhere classified: Secondary | ICD-10-CM
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
## 2022-04-18 ENCOUNTER — Ambulatory Visit
Admission: EM | Admit: 2022-04-18 | Discharge: 2022-04-18 | Disposition: A | Discharge status: Home / Self Care | Payer: BC Managed Care – PPO

## 2022-04-18 DIAGNOSIS — S91331A Puncture wound without foreign body, right foot, initial encounter: Secondary | ICD-10-CM

## 2022-04-18 DIAGNOSIS — Z23 Encounter for immunization: Secondary | ICD-10-CM | POA: Diagnosis not present

## 2022-04-18 MED ORDER — TETANUS-DIPHTHERIA TOXOIDS TD 5-2 LFU IM INJ
0.5000 mL | INJECTION | Freq: Once | INTRAMUSCULAR | Status: AC
  Administered 2022-04-18: 0.5 mL via INTRAMUSCULAR

## 2022-04-18 MED ORDER — LEVOFLOXACIN 750 MG PO TABS: 750.0000 mg | ORAL_TABLET | Freq: Every day | ORAL | 0 refills | Status: AC
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
## 2022-04-18 NOTE — ED Triage Notes (Signed)
Patient presents to [facility] for right foot pain from puncture wound last night. States she stepped on a rust nail. Last Tdap 7 years ago. Believes she thinks she hit a nerve since she has a lot of pain. Treating pain with oxy.

## 2022-04-18 NOTE — Discharge Instructions (Addendum)
You have been prescribed an antibiotic prevent infection to your foot given your injury.  Watch for signs or symptoms of infection including worsening pain at the site, redness, swelling, discharge.  Follow up here or with your primary care provider if your symptoms are worsening or not improving with treatment, or if you need additional treatment for pain.

## 2022-04-19 ENCOUNTER — Ambulatory Visit: Payer: BC Managed Care – PPO | Admitting: Physical Therapy
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
## 2022-04-24 ENCOUNTER — Ambulatory Visit: Payer: BC Managed Care – PPO | Admitting: Physical Therapy
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
## 2022-04-26 ENCOUNTER — Ambulatory Visit: Payer: BC Managed Care – PPO | Admitting: Physical Therapy

## 2022-05-10 ENCOUNTER — Ambulatory Visit: Payer: BC Managed Care – PPO | Attending: Psychiatry | Admitting: Physical Therapy

## 2022-05-10 DIAGNOSIS — R42 Dizziness and giddiness: Secondary | ICD-10-CM | POA: Diagnosis present

## 2022-05-10 DIAGNOSIS — R2689 Other abnormalities of gait and mobility: Secondary | ICD-10-CM | POA: Diagnosis present

## 2022-05-10 DIAGNOSIS — R262 Difficulty in walking, not elsewhere classified: Secondary | ICD-10-CM | POA: Insufficient documentation
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
## 2022-05-11 ENCOUNTER — Encounter: Payer: Self-pay | Admitting: Physical Therapy

## 2022-05-15 ENCOUNTER — Ambulatory Visit: Payer: BC Managed Care – PPO | Admitting: Physical Therapy

## 2022-05-15 DIAGNOSIS — R262 Difficulty in walking, not elsewhere classified: Secondary | ICD-10-CM

## 2022-05-15 DIAGNOSIS — R42 Dizziness and giddiness: Secondary | ICD-10-CM | POA: Diagnosis not present

## 2022-05-15 DIAGNOSIS — R2689 Other abnormalities of gait and mobility: Secondary | ICD-10-CM
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
## 2022-05-17 ENCOUNTER — Encounter: Payer: Self-pay | Admitting: Physical Therapy

## 2022-05-22 ENCOUNTER — Ambulatory Visit: Payer: BC Managed Care – PPO | Admitting: Physical Therapy

## 2022-05-22 ENCOUNTER — Encounter: Payer: Self-pay | Admitting: Physical Therapy

## 2022-05-22 DIAGNOSIS — R42 Dizziness and giddiness: Secondary | ICD-10-CM | POA: Diagnosis not present

## 2022-05-22 DIAGNOSIS — R262 Difficulty in walking, not elsewhere classified: Secondary | ICD-10-CM

## 2022-05-22 DIAGNOSIS — R2689 Other abnormalities of gait and mobility: Secondary | ICD-10-CM
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
## 2022-05-22 NOTE — Addendum Note (Signed)
Addended by: Consuela Mimes T on: 05/22/2022 01:03 PM   Modules accepted: Orders

## 2022-05-22 NOTE — Patient Instructions (Signed)
Back date recert 11/06

## 2022-05-24 ENCOUNTER — Ambulatory Visit: Payer: BC Managed Care – PPO | Admitting: Physical Therapy

## 2022-05-24 NOTE — Addendum Note (Signed)
Addended by: Consuela Mimes T on: 05/24/2022 12:40 PM   Modules accepted: Orders
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
## 2022-05-31 ENCOUNTER — Encounter: Payer: Self-pay | Admitting: Physical Therapy

## 2022-05-31 ENCOUNTER — Ambulatory Visit: Payer: BC Managed Care – PPO | Admitting: Physical Therapy

## 2022-05-31 DIAGNOSIS — R42 Dizziness and giddiness: Secondary | ICD-10-CM | POA: Diagnosis not present

## 2022-05-31 DIAGNOSIS — R2689 Other abnormalities of gait and mobility: Secondary | ICD-10-CM

## 2022-05-31 DIAGNOSIS — R262 Difficulty in walking, not elsewhere classified: Secondary | ICD-10-CM
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
## 2022-06-07 ENCOUNTER — Ambulatory Visit: Payer: BC Managed Care – PPO | Admitting: Physical Therapy
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
## 2022-06-14 ENCOUNTER — Ambulatory Visit: Payer: BC Managed Care – PPO | Attending: Psychiatry | Admitting: Physical Therapy

## 2022-06-14 DIAGNOSIS — R262 Difficulty in walking, not elsewhere classified: Secondary | ICD-10-CM | POA: Diagnosis present

## 2022-06-14 DIAGNOSIS — R2689 Other abnormalities of gait and mobility: Secondary | ICD-10-CM | POA: Diagnosis present

## 2022-06-14 DIAGNOSIS — R42 Dizziness and giddiness: Secondary | ICD-10-CM | POA: Diagnosis present
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
## 2022-06-19 ENCOUNTER — Encounter: Payer: Self-pay | Admitting: Physical Therapy

## 2022-06-21 ENCOUNTER — Ambulatory Visit: Payer: BC Managed Care – PPO | Admitting: Physical Therapy

## 2022-06-21 DIAGNOSIS — R42 Dizziness and giddiness: Secondary | ICD-10-CM | POA: Diagnosis not present

## 2022-06-21 DIAGNOSIS — R2689 Other abnormalities of gait and mobility: Secondary | ICD-10-CM

## 2022-06-21 DIAGNOSIS — R262 Difficulty in walking, not elsewhere classified: Secondary | ICD-10-CM
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
## 2022-06-28 ENCOUNTER — Encounter: Payer: Self-pay | Admitting: Physical Therapy

## 2022-06-28 ENCOUNTER — Ambulatory Visit: Payer: BC Managed Care – PPO | Admitting: Physical Therapy

## 2022-06-28 DIAGNOSIS — R262 Difficulty in walking, not elsewhere classified: Secondary | ICD-10-CM

## 2022-06-28 DIAGNOSIS — R42 Dizziness and giddiness: Secondary | ICD-10-CM

## 2022-06-28 DIAGNOSIS — R2689 Other abnormalities of gait and mobility: Secondary | ICD-10-CM
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
## 2022-07-03 ENCOUNTER — Ambulatory Visit (INDEPENDENT_AMBULATORY_CARE_PROVIDER_SITE_OTHER): Payer: BC Managed Care – PPO | Admitting: Adult Health

## 2022-07-03 ENCOUNTER — Encounter: Payer: Self-pay | Admitting: Adult Health

## 2022-07-03 VITALS — BP 115/75 | HR 83 | Ht 66.0 in | Wt 212.0 lb

## 2022-07-03 DIAGNOSIS — R2689 Other abnormalities of gait and mobility: Secondary | ICD-10-CM | POA: Diagnosis not present

## 2022-07-03 DIAGNOSIS — R42 Dizziness and giddiness: Secondary | ICD-10-CM

## 2022-07-03 NOTE — Patient Instructions (Addendum)
Your Plan:  Continue working with PT for hopeful ongoing recovery     Follow up in 4 months or call earlier if needed     Thank you for coming to see [place] at [facility]. I hope we have been able to provide you high quality care today.  You may receive a patient satisfaction survey over the next few weeks. We would appreciate your feedback and comments so that we may continue to improve ourselves and the health of our patients.

## 2022-07-05 ENCOUNTER — Ambulatory Visit: Payer: BC Managed Care – PPO | Admitting: Physical Therapy
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
## 2022-07-11 ENCOUNTER — Ambulatory Visit: Payer: BC Managed Care – PPO | Attending: Psychiatry | Admitting: Physical Therapy

## 2022-07-11 DIAGNOSIS — R42 Dizziness and giddiness: Secondary | ICD-10-CM | POA: Insufficient documentation

## 2022-07-11 DIAGNOSIS — R262 Difficulty in walking, not elsewhere classified: Secondary | ICD-10-CM | POA: Insufficient documentation

## 2022-07-11 DIAGNOSIS — R2689 Other abnormalities of gait and mobility: Secondary | ICD-10-CM | POA: Insufficient documentation
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
## 2022-07-19 ENCOUNTER — Encounter: Payer: Self-pay | Admitting: Physical Therapy

## 2022-07-19 ENCOUNTER — Ambulatory Visit: Payer: BC Managed Care – PPO | Attending: Psychiatry | Admitting: Physical Therapy

## 2022-07-19 DIAGNOSIS — R262 Difficulty in walking, not elsewhere classified: Secondary | ICD-10-CM | POA: Insufficient documentation

## 2022-07-19 DIAGNOSIS — R42 Dizziness and giddiness: Secondary | ICD-10-CM | POA: Diagnosis present

## 2022-07-19 DIAGNOSIS — R2689 Other abnormalities of gait and mobility: Secondary | ICD-10-CM | POA: Diagnosis present
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
## 2022-07-27 ENCOUNTER — Ambulatory Visit: Payer: BC Managed Care – PPO | Admitting: Physical Therapy

## 2022-07-27 DIAGNOSIS — R42 Dizziness and giddiness: Secondary | ICD-10-CM

## 2022-07-27 DIAGNOSIS — R262 Difficulty in walking, not elsewhere classified: Secondary | ICD-10-CM

## 2022-07-27 DIAGNOSIS — R2689 Other abnormalities of gait and mobility: Secondary | ICD-10-CM
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
## 2022-08-03 ENCOUNTER — Encounter: Payer: Self-pay | Admitting: Physical Therapy

## 2022-08-03 ENCOUNTER — Ambulatory Visit: Payer: BC Managed Care – PPO | Admitting: Physical Therapy

## 2022-08-03 DIAGNOSIS — R2689 Other abnormalities of gait and mobility: Secondary | ICD-10-CM

## 2022-08-03 DIAGNOSIS — R42 Dizziness and giddiness: Secondary | ICD-10-CM | POA: Diagnosis not present

## 2022-08-03 DIAGNOSIS — R262 Difficulty in walking, not elsewhere classified: Secondary | ICD-10-CM
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
## 2022-08-09 ENCOUNTER — Ambulatory Visit: Payer: BC Managed Care – PPO | Admitting: Physical Therapy
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
## 2022-08-16 ENCOUNTER — Ambulatory Visit: Payer: BC Managed Care – PPO | Admitting: Physical Therapy
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
## 2022-08-30 ENCOUNTER — Ambulatory Visit: Payer: BC Managed Care – PPO | Attending: Psychiatry | Admitting: Physical Therapy

## 2022-08-30 ENCOUNTER — Encounter: Payer: Self-pay | Admitting: Physical Therapy

## 2022-08-30 DIAGNOSIS — R262 Difficulty in walking, not elsewhere classified: Secondary | ICD-10-CM | POA: Diagnosis present

## 2022-08-30 DIAGNOSIS — R2689 Other abnormalities of gait and mobility: Secondary | ICD-10-CM | POA: Diagnosis present

## 2022-08-30 DIAGNOSIS — R42 Dizziness and giddiness: Secondary | ICD-10-CM | POA: Insufficient documentation
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
## 2022-09-13 ENCOUNTER — Encounter: Payer: Self-pay | Admitting: Physical Therapy

## 2022-09-13 ENCOUNTER — Ambulatory Visit: Payer: BC Managed Care – PPO | Attending: Psychiatry | Admitting: Physical Therapy

## 2022-09-13 DIAGNOSIS — R2689 Other abnormalities of gait and mobility: Secondary | ICD-10-CM | POA: Insufficient documentation

## 2022-09-13 DIAGNOSIS — R262 Difficulty in walking, not elsewhere classified: Secondary | ICD-10-CM | POA: Diagnosis present

## 2022-09-13 DIAGNOSIS — R42 Dizziness and giddiness: Secondary | ICD-10-CM | POA: Insufficient documentation
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
## 2022-09-27 ENCOUNTER — Ambulatory Visit: Payer: BC Managed Care – PPO | Admitting: Physical Therapy
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
## 2022-10-04 ENCOUNTER — Ambulatory Visit: Payer: BC Managed Care – PPO | Admitting: Physical Therapy

## 2022-10-11 ENCOUNTER — Ambulatory Visit: Payer: BC Managed Care – PPO | Attending: Psychiatry | Admitting: Physical Therapy

## 2022-10-11 DIAGNOSIS — R42 Dizziness and giddiness: Secondary | ICD-10-CM | POA: Insufficient documentation

## 2022-10-11 DIAGNOSIS — R262 Difficulty in walking, not elsewhere classified: Secondary | ICD-10-CM | POA: Insufficient documentation

## 2022-10-11 DIAGNOSIS — R2689 Other abnormalities of gait and mobility: Secondary | ICD-10-CM | POA: Diagnosis present
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
## 2022-10-16 ENCOUNTER — Encounter: Payer: Self-pay | Admitting: Physical Therapy

## 2022-10-18 ENCOUNTER — Ambulatory Visit: Payer: BC Managed Care – PPO | Admitting: Physical Therapy

## 2022-10-18 DIAGNOSIS — R262 Difficulty in walking, not elsewhere classified: Secondary | ICD-10-CM

## 2022-10-18 DIAGNOSIS — R42 Dizziness and giddiness: Secondary | ICD-10-CM

## 2022-10-18 DIAGNOSIS — R2689 Other abnormalities of gait and mobility: Secondary | ICD-10-CM
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
## 2022-10-21 ENCOUNTER — Encounter: Payer: Self-pay | Admitting: Physical Therapy

## 2022-10-24 ENCOUNTER — Ambulatory Visit: Payer: BC Managed Care – PPO | Admitting: Physical Therapy

## 2022-11-07 ENCOUNTER — Ambulatory Visit: Payer: BC Managed Care – PPO | Admitting: Physical Therapy

## 2022-11-08 ENCOUNTER — Ambulatory Visit: Payer: BC Managed Care – PPO | Attending: Psychiatry | Admitting: Physical Therapy

## 2022-11-08 DIAGNOSIS — R2689 Other abnormalities of gait and mobility: Secondary | ICD-10-CM | POA: Diagnosis present

## 2022-11-08 DIAGNOSIS — R42 Dizziness and giddiness: Secondary | ICD-10-CM

## 2022-11-08 DIAGNOSIS — R262 Difficulty in walking, not elsewhere classified: Secondary | ICD-10-CM
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
## 2022-11-11 ENCOUNTER — Encounter: Payer: Self-pay | Admitting: Physical Therapy

## 2022-11-22 ENCOUNTER — Ambulatory Visit: Payer: BC Managed Care – PPO | Admitting: Physical Therapy

## 2022-11-29 ENCOUNTER — Ambulatory Visit: Payer: BC Managed Care – PPO | Admitting: Physical Therapy

## 2022-11-29 DIAGNOSIS — R42 Dizziness and giddiness: Secondary | ICD-10-CM

## 2022-11-29 DIAGNOSIS — R262 Difficulty in walking, not elsewhere classified: Secondary | ICD-10-CM

## 2022-11-29 DIAGNOSIS — R2689 Other abnormalities of gait and mobility: Secondary | ICD-10-CM
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
## 2022-12-04 ENCOUNTER — Encounter: Payer: Self-pay | Admitting: Physical Therapy

## 2022-12-06 ENCOUNTER — Encounter: Payer: Self-pay | Admitting: Physical Therapy

## 2022-12-06 ENCOUNTER — Ambulatory Visit: Payer: BC Managed Care – PPO | Attending: Psychiatry | Admitting: Physical Therapy

## 2022-12-06 DIAGNOSIS — R2689 Other abnormalities of gait and mobility: Secondary | ICD-10-CM | POA: Diagnosis present

## 2022-12-06 DIAGNOSIS — R42 Dizziness and giddiness: Secondary | ICD-10-CM | POA: Diagnosis present

## 2022-12-06 DIAGNOSIS — R262 Difficulty in walking, not elsewhere classified: Secondary | ICD-10-CM | POA: Insufficient documentation
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
## 2022-12-12 ENCOUNTER — Ambulatory Visit: Payer: BC Managed Care – PPO | Admitting: Family Medicine

## 2022-12-18 ENCOUNTER — Encounter: Payer: Self-pay | Admitting: Family Medicine

## 2022-12-18 ENCOUNTER — Ambulatory Visit: Payer: BC Managed Care – PPO | Admitting: Family Medicine
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
## 2022-12-19 ENCOUNTER — Ambulatory Visit (INDEPENDENT_AMBULATORY_CARE_PROVIDER_SITE_OTHER): Payer: BC Managed Care – PPO | Admitting: Adult Health

## 2022-12-19 ENCOUNTER — Encounter: Payer: Self-pay | Admitting: Adult Health

## 2022-12-19 VITALS — BP 117/77 | HR 87 | Ht 66.0 in | Wt 212.0 lb

## 2022-12-19 DIAGNOSIS — R55 Syncope and collapse: Secondary | ICD-10-CM

## 2022-12-19 DIAGNOSIS — R42 Dizziness and giddiness: Secondary | ICD-10-CM | POA: Diagnosis not present

## 2022-12-19 MED ORDER — SCOPOLAMINE 1 MG/3DAYS TD PT72: 1.0000 | MEDICATED_PATCH | TRANSDERMAL | 12 refills | Status: DC

## 2022-12-19 NOTE — Patient Instructions (Addendum)
Your Plan:  Continue physical therapy for vestibular rehab  Recommend completing a 30 day cardiac monitor for syncopal events  Start scopolamine patches to see if this helps with dizziness. You will replace the patch every 3 days. If not helpful over 3-4 weeks, can be discontinued     Follow up with Dr. Delena Bali in 2 months or call earlier if needed     Thank you for coming to see [place] at [facility]. I hope we have been able to provide you high quality care today.  You may receive a patient satisfaction survey over the next few weeks. We would appreciate your feedback and comments so that we may continue to improve ourselves and the health of our patients.
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
## 2022-12-20 ENCOUNTER — Ambulatory Visit: Payer: BC Managed Care – PPO | Admitting: Physical Therapy

## 2022-12-20 ENCOUNTER — Encounter: Payer: Self-pay | Admitting: Physical Therapy

## 2022-12-20 DIAGNOSIS — R42 Dizziness and giddiness: Secondary | ICD-10-CM | POA: Diagnosis not present

## 2022-12-20 DIAGNOSIS — R2689 Other abnormalities of gait and mobility: Secondary | ICD-10-CM

## 2022-12-20 DIAGNOSIS — R262 Difficulty in walking, not elsewhere classified: Secondary | ICD-10-CM
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
## 2023-01-02 ENCOUNTER — Ambulatory Visit: Payer: BC Managed Care – PPO | Admitting: Physical Therapy

## 2023-01-02 DIAGNOSIS — R262 Difficulty in walking, not elsewhere classified: Secondary | ICD-10-CM

## 2023-01-02 DIAGNOSIS — R42 Dizziness and giddiness: Secondary | ICD-10-CM

## 2023-01-02 DIAGNOSIS — R2689 Other abnormalities of gait and mobility: Secondary | ICD-10-CM
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
## 2023-01-03 ENCOUNTER — Encounter: Payer: Self-pay | Admitting: Physical Therapy

## 2023-01-15 ENCOUNTER — Encounter: Payer: Self-pay | Admitting: Physical Therapy

## 2023-01-15 ENCOUNTER — Ambulatory Visit: Payer: BC Managed Care – PPO | Attending: Psychiatry | Admitting: Physical Therapy

## 2023-01-15 DIAGNOSIS — R2689 Other abnormalities of gait and mobility: Secondary | ICD-10-CM | POA: Diagnosis present

## 2023-01-15 DIAGNOSIS — R42 Dizziness and giddiness: Secondary | ICD-10-CM

## 2023-01-15 DIAGNOSIS — R262 Difficulty in walking, not elsewhere classified: Secondary | ICD-10-CM
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
## 2023-01-30 ENCOUNTER — Ambulatory Visit: Payer: BC Managed Care – PPO | Admitting: Physical Therapy

## 2023-02-12 ENCOUNTER — Encounter: Payer: Self-pay | Admitting: Psychiatry

## 2023-02-12 ENCOUNTER — Ambulatory Visit (INDEPENDENT_AMBULATORY_CARE_PROVIDER_SITE_OTHER): Payer: BC Managed Care – PPO | Admitting: Psychiatry

## 2023-02-12 VITALS — BP 106/70 | HR 82 | Ht 66.0 in | Wt 209.2 lb

## 2023-02-12 DIAGNOSIS — R42 Dizziness and giddiness: Secondary | ICD-10-CM | POA: Diagnosis not present

## 2023-02-12 DIAGNOSIS — R55 Syncope and collapse: Secondary | ICD-10-CM

## 2023-02-12 DIAGNOSIS — G43009 Migraine without aura, not intractable, without status migrainosus: Secondary | ICD-10-CM | POA: Diagnosis not present

## 2023-02-12 MED ORDER — RIZATRIPTAN BENZOATE 10 MG PO TABS: 10.0000 mg | ORAL_TABLET | ORAL | 6 refills | Status: DC | PRN

## 2023-02-12 NOTE — Patient Instructions (Signed)
  Guidelines for the Management of Orthostatic intolerance  1. Make all postural changes from lying to sitting or sitting to standing, slowly.  2. Drink to 2.0 -2.5 L of fluids per day.  3. Increase sodium in the diet to 3 - 5 g per day.  4. Avoid large meals which can cause low blood pressure during digestion. It is better to eat smaller meals more often than three large meals.  5. Avoid alcohol. Alcohol and cause blood to pool in the legs which may worsen low blood pressure reactions when standing. 6. Perform lower extremity exercises to improve strength of the leg muscles. This will help prevent blood from a pooling in the legs when standing and walking.  7. Raise the head of the bed by 6 to 10 inches. The entire bed must be at an angle. Raising only the head portion of the bed at waist level or using pillows will not be effective. Raising the head of the bed will reduce urine formation overnight and there will be more volume in the circulation in the morning.  8. During bad days, drink 500 cc of water quickly. This will result in an increased blood pressure within 5 minutes of drinking the water. The effect will last up to one hour and may improve orthostatic intolerance.  9. Use custom fitted elastic support stockings. These will reduce a tendency for blood to pool in the legs when standing and may improve orthostatic intolerance.  10. Use physical counter maneuvers such as leg crossing, squatting, or raising and resting the leg on a chair. These maneuvers increase blood pressure and can improve orthostatic intolerance.
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
## 2023-02-13 ENCOUNTER — Ambulatory Visit: Payer: BC Managed Care – PPO | Attending: Psychiatry | Admitting: Physical Therapy

## 2023-02-13 DIAGNOSIS — R42 Dizziness and giddiness: Secondary | ICD-10-CM | POA: Insufficient documentation

## 2023-02-13 DIAGNOSIS — R2689 Other abnormalities of gait and mobility: Secondary | ICD-10-CM | POA: Insufficient documentation

## 2023-02-13 DIAGNOSIS — R262 Difficulty in walking, not elsewhere classified: Secondary | ICD-10-CM | POA: Insufficient documentation

## 2023-02-15 ENCOUNTER — Encounter: Payer: Self-pay | Admitting: *Deleted

## 2023-02-22 ENCOUNTER — Ambulatory Visit: Payer: BC Managed Care – PPO

## 2023-02-22 ENCOUNTER — Ambulatory Visit: Payer: BC Managed Care – PPO | Attending: Cardiology | Admitting: Cardiology

## 2023-02-22 ENCOUNTER — Encounter: Payer: Self-pay | Admitting: Cardiology

## 2023-02-22 VITALS — BP 112/76 | HR 61 | Wt 211.0 lb

## 2023-02-22 DIAGNOSIS — R002 Palpitations: Secondary | ICD-10-CM

## 2023-02-22 DIAGNOSIS — R55 Syncope and collapse: Secondary | ICD-10-CM

## 2023-02-22 DIAGNOSIS — R42 Dizziness and giddiness: Secondary | ICD-10-CM

## 2023-02-22 DIAGNOSIS — F17200 Nicotine dependence, unspecified, uncomplicated: Secondary | ICD-10-CM

## 2023-02-22 NOTE — Patient Instructions (Signed)
Medication Instructions:   Your physician recommends that you continue on your current medications as directed. Please refer to the Current Medication list given to you today.  *If you need a refill on your cardiac medications before your next appointment, please call your pharmacy*   Lab Work:  None Ordered  If you have labs (blood work) drawn today and your tests are completely normal, you will receive your results only by: MyChart Message (if you have MyChart) OR A paper copy in the mail If you have any lab test that is abnormal or we need to change your treatment, we will call you to review the results.   Testing/Procedures:  Your physician has recommended that you wear a Zio monitor.   This monitor is a medical device that records the heart's electrical activity. Doctors most often use these monitors to diagnose arrhythmias. Arrhythmias are problems with the speed or rhythm of the heartbeat. The monitor is a small device applied to your chest. You can wear one while you do your normal daily activities. While wearing this monitor if you have any symptoms to push the button and record what you felt. Once you have worn this monitor for the period of time provider prescribed (Usually 14 days), you will return the monitor device in the postage paid box. Once it is returned they will download the data collected and provide [place] with a report which the provider will then review and we will call you with those results. Important tips:  Avoid showering during the first 24 hours of wearing the monitor. Avoid excessive sweating to help maximize wear time. Do not submerge the device, no hot tubs, and no swimming pools. Keep any lotions or oils away from the patch. After 24 hours you may shower with the patch on. Take brief showers with your back facing the shower head.  Do not remove patch once it has been placed because that will interrupt data and decrease adhesive wear time. Push the button  when you have any symptoms and write down what you were feeling. Once you have completed wearing your monitor, remove and place into box which has postage paid and place in your outgoing mailbox.  If for some reason you have misplaced your box then call our office and we can provide another box and/or mail it off for you.      Follow-Up: At [facility], you and your health needs are our priority.  As part of our continuing mission to provide you with exceptional heart care, we have created designated Provider Care Teams.  These Care Teams include your primary Cardiologist (physician) and Advanced Practice Providers (APPs -  Physician Assistants and Nurse Practitioners) who all work together to provide you with the care you need, when you need it.  We recommend signing up for the patient portal called "MyChart".  Sign up information is provided on this After Visit Summary.  MyChart is used to connect with patients for Virtual Visits (Telemedicine).  Patients are able to view lab/test results, encounter notes, upcoming appointments, etc.  Non-urgent messages can be sent to your provider as well.   To learn more about what you can do with MyChart, go to [email or web address].    Your next appointment:    After Testing   Provider:   You may see Debbe Odea, MD or one of the following Advanced Practice Providers on your designated Care Team:   Nicolasa Ducking, NP Eula Listen, PA-C Cadence Fransico Michael, PA-C Charlsie Quest, NP
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
## 2023-02-27 ENCOUNTER — Ambulatory Visit: Payer: BC Managed Care – PPO | Admitting: Physical Therapy
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
## 2023-03-15 ENCOUNTER — Telehealth: Payer: Self-pay | Admitting: Psychiatry

## 2023-03-15 NOTE — Telephone Encounter (Signed)
I received a call from the [facility] in [place] that this patient is scheduled for an echo that was ordered by Dr. Delena Bali and she told that office that she now has insurance and they asked me to get it from her so that we can submit a PA for the echo. They tried to get the info on the phone when they scheduled the appointment but the patient didn't have it available and had told them she would call back with it.

## 2023-03-19 ENCOUNTER — Ambulatory Visit: Payer: BC Managed Care – PPO | Attending: Psychiatry

## 2023-03-19 DIAGNOSIS — R55 Syncope and collapse: Secondary | ICD-10-CM | POA: Diagnosis not present

## 2023-03-19 DIAGNOSIS — R42 Dizziness and giddiness: Secondary | ICD-10-CM | POA: Diagnosis not present

## 2023-03-19 LAB — ECHOCARDIOGRAM COMPLETE BUBBLE STUDY
Area-P 1/2: 4.31 cm2
S' Lateral: 3.4 cm

## 2023-03-19 NOTE — Telephone Encounter (Signed)
no auth required the ref: Irving Burton 03/19/23

## 2023-03-20 NOTE — Progress Notes (Signed)
Please call and inform patient that echo showed a normal ventricular function with an EF of 60-65%. There was also a small PFO found during the study.

## 2023-03-21 ENCOUNTER — Telehealth: Payer: Self-pay

## 2023-03-21 NOTE — Telephone Encounter (Signed)
Called pt and told her Echocardiogram Results per Dr. Teresa Coombs " echo showed a normal ventricular function with an EF of 60-65%. There was also a small PFO found during the study." Pt verbalized understanding.

## 2023-04-05 ENCOUNTER — Ambulatory Visit: Payer: BC Managed Care – PPO | Attending: Cardiology | Admitting: Cardiology

## 2023-04-05 ENCOUNTER — Encounter: Payer: Self-pay | Admitting: Cardiology

## 2023-04-05 VITALS — BP 104/70 | HR 79 | Resp 98 | Wt 207.2 lb

## 2023-04-05 DIAGNOSIS — R55 Syncope and collapse: Secondary | ICD-10-CM | POA: Diagnosis not present

## 2023-04-05 DIAGNOSIS — F17200 Nicotine dependence, unspecified, uncomplicated: Secondary | ICD-10-CM | POA: Diagnosis not present

## 2023-04-05 DIAGNOSIS — R002 Palpitations: Secondary | ICD-10-CM | POA: Diagnosis not present

## 2023-04-05 DIAGNOSIS — R42 Dizziness and giddiness: Secondary | ICD-10-CM
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
## 2023-04-05 NOTE — Patient Instructions (Signed)
Medication Instructions:   Your physician recommends that you continue on your current medications as directed. Please refer to the Current Medication list given to you today.  *If you need a refill on your cardiac medications before your next appointment, please call your pharmacy*   Lab Work:  None Ordered  If you have labs (blood work) drawn today and your tests are completely normal, you will receive your results only by: MyChart Message (if you have MyChart) OR A paper copy in the mail If you have any lab test that is abnormal or we need to change your treatment, we will call you to review the results.   Testing/Procedures:  None Ordered   Follow-Up: At [facility], you and your health needs are our priority.  As part of our continuing mission to provide you with exceptional heart care, we have created designated Provider Care Teams.  These Care Teams include your primary Cardiologist (physician) and Advanced Practice Providers (APPs -  Physician Assistants and Nurse Practitioners) who all work together to provide you with the care you need, when you need it.  We recommend signing up for the patient portal called "MyChart".  Sign up information is provided on this After Visit Summary.  MyChart is used to connect with patients for Virtual Visits (Telemedicine).  Patients are able to view lab/test results, encounter notes, upcoming appointments, etc.  Non-urgent messages can be sent to your provider as well.   To learn more about what you can do with MyChart, go to [email or web address].    Your next appointment:    As needed 

## 2023-04-12 ENCOUNTER — Telehealth: Payer: Self-pay

## 2023-04-12 NOTE — Telephone Encounter (Signed)
Followed up on patients ZIO.   Per [employer] - its lost in the mail.   Prescription Status: Completed  Enrollment Type: Home  Wear Duration: 14 Days  Start Date: 02/22/2023  Prescriber: Debbe Odea  Monitor Status Christena Deem [id number] 02/22/2023 Lost

## 2023-04-13 NOTE — Telephone Encounter (Signed)
Mychart message sent to patient.
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
## 2023-08-29 ENCOUNTER — Ambulatory Visit (INDEPENDENT_AMBULATORY_CARE_PROVIDER_SITE_OTHER): Payer: BC Managed Care – PPO | Admitting: Adult Health

## 2023-08-29 ENCOUNTER — Encounter: Payer: Self-pay | Admitting: Adult Health

## 2023-08-29 VITALS — BP 105/68 | HR 68 | Ht 67.0 in | Wt 199.0 lb

## 2023-08-29 DIAGNOSIS — R42 Dizziness and giddiness: Secondary | ICD-10-CM | POA: Diagnosis not present

## 2023-08-29 DIAGNOSIS — G43009 Migraine without aura, not intractable, without status migrainosus: Secondary | ICD-10-CM | POA: Diagnosis not present

## 2023-08-29 NOTE — Patient Instructions (Addendum)
 Your Plan:  It was great to see you today and doing much better compared to our last visit!   Please let me know if headaches or vertigo should worsen     Follow-up in 6 months, if you continue to do well we will plan on follow-up as needed     Thank you for coming to see [place] at [facility]. I hope we have been able to provide you high quality care today.  You may receive a patient satisfaction survey over the next few weeks. We would appreciate your feedback and comments so that we may continue to improve ourselves and the health of our patients.

## 2024-03-19 ENCOUNTER — Telehealth: Payer: Self-pay | Admitting: Adult Health

## 2024-03-19 ENCOUNTER — Ambulatory Visit: Admitting: Adult Health

## 2024-03-19 NOTE — Telephone Encounter (Signed)
 Pt cx, she is sick

## 2024-03-26 ENCOUNTER — Encounter: Payer: Self-pay | Admitting: Emergency Medicine

## 2024-03-26 ENCOUNTER — Ambulatory Visit
Admission: EM | Admit: 2024-03-26 | Discharge: 2024-03-26 | Disposition: A | Discharge status: Home / Self Care | Payer: Self-pay | Attending: Emergency Medicine | Admitting: Emergency Medicine

## 2024-03-26 DIAGNOSIS — J069 Acute upper respiratory infection, unspecified: Secondary | ICD-10-CM | POA: Insufficient documentation

## 2024-03-26 LAB — GROUP A STREP BY PCR: Group A Strep by PCR: NOT DETECTED

## 2024-03-26 MED ORDER — IPRATROPIUM BROMIDE 0.06 % NA SOLN: 2.0000 | Freq: Four times a day (QID) | NASAL | 12 refills | Status: AC

## 2024-03-26 MED ORDER — BENZONATATE 100 MG PO CAPS: 200.0000 mg | ORAL_CAPSULE | Freq: Three times a day (TID) | ORAL | 0 refills | Status: AC

## 2024-03-26 MED ORDER — PROMETHAZINE-DM 6.25-15 MG/5ML PO SYRP: 5.0000 mL | ORAL_SOLUTION | Freq: Four times a day (QID) | ORAL | 0 refills | Status: AC | PRN

## 2024-03-26 NOTE — ED Triage Notes (Signed)
 Pt c/o sore throat x2 days. Has tried OTC meds w/o relief.
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
## 2024-03-26 NOTE — Discharge Instructions (Signed)
 Your strep test today was negative but your exam is consistent with a viral upper respiratory tract infection.  Please use over-the-counter Tylenol and/or ibuprofen according to the package instructions as needed for any fever or pain.  To soothe your throat may gargle with warm salt water as often as you like.  You may also use over-the-counter Chloraseptic or Sucrets lozenges.  No more than 1 lozenge every 2 hours as the menthol may give you diarrhea.Use the Atrovent nasal spray, 2 squirts in each nostril every 6 hours, as needed for runny nose and postnasal drip.  Use the Tessalon Perles every 8 hours during the day.  Take them with a small sip of water.  They may give you some numbness to the base of your tongue or a metallic taste in your mouth, this is normal.  Use the Promethazine DM cough syrup at bedtime for cough and congestion.  It will make you drowsy so do not take it during the day.  Return for reevaluation or see your primary care provider for any new or worsening symptoms.

## 2024-05-09 ENCOUNTER — Telehealth: Payer: Self-pay | Admitting: Adult Health

## 2024-05-09 ENCOUNTER — Encounter: Payer: Self-pay | Admitting: Adult Health

## 2024-05-09 DIAGNOSIS — R42 Dizziness and giddiness: Secondary | ICD-10-CM

## 2024-05-09 DIAGNOSIS — R55 Syncope and collapse: Secondary | ICD-10-CM

## 2024-05-09 DIAGNOSIS — R6889 Other general symptoms and signs: Secondary | ICD-10-CM

## 2024-05-09 DIAGNOSIS — Z87828 Personal history of other (healed) physical injury and trauma: Secondary | ICD-10-CM

## 2024-05-09 DIAGNOSIS — F0781 Postconcussional syndrome: Secondary | ICD-10-CM

## 2024-05-09 DIAGNOSIS — H532 Diplopia: Secondary | ICD-10-CM
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
## 2024-05-13 ENCOUNTER — Encounter: Payer: Self-pay | Admitting: Adult Health
